# Patient Record
Sex: Male | Born: 1955 | Race: White | Hispanic: No | Smoking: Current every day smoker
Health system: Southern US, Community
[De-identification: ages and names within clinical notes are randomized; demographics above are authoritative.]

---

## 2018-02-23 ENCOUNTER — Emergency Department (HOSPITAL_COMMUNITY): Payer: No Typology Code available for payment source

## 2018-02-23 ENCOUNTER — Other Ambulatory Visit: Payer: Self-pay

## 2018-02-23 ENCOUNTER — Emergency Department (HOSPITAL_COMMUNITY)
Admission: EM | Admit: 2018-02-23 | Discharge: 2018-02-23 | Disposition: A | Discharge status: Home / Self Care | Payer: No Typology Code available for payment source | Attending: Emergency Medicine | Admitting: Emergency Medicine

## 2018-02-23 DIAGNOSIS — F1410 Cocaine abuse, uncomplicated: Secondary | ICD-10-CM | POA: Diagnosis not present

## 2018-02-23 DIAGNOSIS — Y9289 Other specified places as the place of occurrence of the external cause: Secondary | ICD-10-CM | POA: Diagnosis not present

## 2018-02-23 DIAGNOSIS — T50901A Poisoning by unspecified drugs, medicaments and biological substances, accidental (unintentional), initial encounter: Secondary | ICD-10-CM | POA: Diagnosis present

## 2018-02-23 DIAGNOSIS — Y998 Other external cause status: Secondary | ICD-10-CM | POA: Insufficient documentation

## 2018-02-23 DIAGNOSIS — F1910 Other psychoactive substance abuse, uncomplicated: Secondary | ICD-10-CM | POA: Diagnosis not present

## 2018-02-23 DIAGNOSIS — Y9389 Activity, other specified: Secondary | ICD-10-CM | POA: Insufficient documentation

## 2018-02-23 LAB — COMPREHENSIVE METABOLIC PANEL
ALBUMIN: 3.6 g/dL (ref 3.5–5.0)
ALT: 27 U/L (ref 17–63)
AST: 29 U/L (ref 15–41)
Alkaline Phosphatase: 50 U/L (ref 38–126)
Anion gap: 9 (ref 5–15)
BUN: 7 mg/dL (ref 6–20)
CHLORIDE: 106 mmol/L (ref 101–111)
CO2: 27 mmol/L (ref 22–32)
Calcium: 9 mg/dL (ref 8.9–10.3)
Creatinine, Ser: 0.99 mg/dL (ref 0.61–1.24)
GFR calc Af Amer: >60 mL/min (ref >60)
Glucose, Bld: 125 mg/dL — ABNORMAL HIGH (ref 65–99)
POTASSIUM: 2.8 mmol/L — AB (ref 3.5–5.1)
SODIUM: 142 mmol/L (ref 135–145)
Total Bilirubin: 0.8 mg/dL (ref 0.3–1.2)
Total Protein: 6.5 g/dL (ref 6.5–8.1)

## 2018-02-23 LAB — MAGNESIUM: Magnesium: 1.7 mg/dL (ref 1.7–2.4)

## 2018-02-23 LAB — CBC WITH DIFFERENTIAL/PLATELET
Basophils Absolute: 0 10*3/uL (ref 0.0–0.1)
Basophils Relative: 0 %
EOS PCT: 3 %
Eosinophils Absolute: 0.2 10*3/uL (ref 0.0–0.7)
HCT: 40.4 % (ref 39.0–52.0)
Hemoglobin: 13.4 g/dL (ref 13.0–17.0)
LYMPHS ABS: 1.9 10*3/uL (ref 0.7–4.0)
LYMPHS PCT: 26 %
MCH: 30.1 pg (ref 26.0–34.0)
MCHC: 33.2 g/dL (ref 30.0–36.0)
MCV: 90.8 fL (ref 78.0–100.0)
Monocytes Absolute: 0.8 10*3/uL (ref 0.1–1.0)
Monocytes Relative: 11 %
Neutro Abs: 4.5 10*3/uL (ref 1.7–7.7)
Neutrophils Relative %: 60 %
PLATELETS: 189 10*3/uL (ref 150–400)
RBC: 4.45 MIL/uL (ref 4.22–5.81)
RDW: 14.2 % (ref 11.5–15.5)
WBC: 7.5 10*3/uL (ref 4.0–10.5)

## 2018-02-23 LAB — I-STAT CHEM 8, ED
BUN: 7 mg/dL (ref 6–20)
CALCIUM ION: 1.17 mmol/L (ref 1.15–1.40)
CHLORIDE: 102 mmol/L (ref 101–111)
CREATININE: 0.9 mg/dL (ref 0.61–1.24)
GLUCOSE: 123 mg/dL — AB (ref 65–99)
HCT: 42 % (ref 39.0–52.0)
Hemoglobin: 14.3 g/dL (ref 13.0–17.0)
Potassium: 2.8 mmol/L — ABNORMAL LOW (ref 3.5–5.1)
Sodium: 142 mmol/L (ref 135–145)
TCO2: 27 mmol/L (ref 22–32)

## 2018-02-23 LAB — ETHANOL

## 2018-02-23 MED ORDER — NALOXONE HCL 2 MG/2ML IJ SOSY
PREFILLED_SYRINGE | INTRAMUSCULAR | Status: DC
Start: 2018-02-23 — End: 2018-02-23
  Filled 2018-02-23: qty 2

## 2018-02-23 MED ORDER — POTASSIUM CHLORIDE CRYS ER 20 MEQ PO TBCR
40.0000 meq | EXTENDED_RELEASE_TABLET | Freq: Once | ORAL | Status: AC
  Administered 2018-02-23: 40 meq via ORAL
  Filled 2018-02-23: qty 2

## 2018-02-23 NOTE — Discharge Instructions (Addendum)
Do not use heroin or cocaine.
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
## 2018-08-03 ENCOUNTER — Emergency Department (HOSPITAL_COMMUNITY)
Admission: EM | Admit: 2018-08-03 | Discharge: 2018-08-03 | Disposition: A | Discharge status: Home / Self Care | Payer: Self-pay | Attending: Emergency Medicine | Admitting: Emergency Medicine

## 2018-08-03 ENCOUNTER — Encounter (HOSPITAL_COMMUNITY): Payer: Self-pay | Admitting: *Deleted

## 2018-08-03 DIAGNOSIS — L02512 Cutaneous abscess of left hand: Secondary | ICD-10-CM | POA: Insufficient documentation

## 2018-08-03 DIAGNOSIS — L0291 Cutaneous abscess, unspecified: Secondary | ICD-10-CM

## 2018-08-03 DIAGNOSIS — F17200 Nicotine dependence, unspecified, uncomplicated: Secondary | ICD-10-CM | POA: Insufficient documentation

## 2018-08-03 MED ORDER — LIDOCAINE HCL (PF) 1 % IJ SOLN
5.0000 mL | Freq: Once | INTRAMUSCULAR | Status: AC
  Administered 2018-08-03: 5 mL
  Filled 2018-08-03: qty 30

## 2018-08-03 MED ORDER — SULFAMETHOXAZOLE-TRIMETHOPRIM 800-160 MG PO TABS
1.0000 | ORAL_TABLET | Freq: Once | ORAL | Status: AC
  Administered 2018-08-03: 1 via ORAL
  Filled 2018-08-03: qty 1

## 2018-08-03 MED ORDER — SULFAMETHOXAZOLE-TRIMETHOPRIM 800-160 MG PO TABS: 1.0000 | ORAL_TABLET | Freq: Two times a day (BID) | ORAL | 0 refills | Status: AC

## 2018-08-03 NOTE — Discharge Instructions (Signed)
Warm compresses to hand for 20 minutes at a time. Take Septra as prescribed and complete the full course. Recheck and packing removal in 2 days with your doctor, referral given for PCP if needed. Return to ER for any worsening or concerning symptoms

## 2018-08-03 NOTE — ED Triage Notes (Signed)
Pt complains of abscess to left hand x 4 days. Pt states he initially had a sore on his left hand.

## 2018-08-03 NOTE — ED Provider Notes (Signed)
[facility]-EMERGENCY DEPT Provider Note   CSN: [id number] Arrival date & time: 08/03/18  1124     History   Chief Complaint Chief Complaint  Patient presents with  . Abscess    HPI Devin PETTIJOHN is a 62 y.o. male.  62 year old right-hand-dominant, nondiabetic male presents with abscess to his left hand.  Patient states that a small cut on his hand and then 2 days ago and noticed the area was becoming red, swollen, painful.  Denies drainage, pain with movement of the fingers or hand, denies history of IV drug abuse.  No other complaints or concerns.     History reviewed. No pertinent past medical history.  There are no active problems to display for this patient.   History reviewed. No pertinent surgical history.      Home Medications    Prior to Admission medications   Medication Sig Start Date End Date Taking? Authorizing Provider  sulfamethoxazole-trimethoprim (BACTRIM DS,SEPTRA DS) 800-160 MG tablet Take 1 tablet by mouth 2 (two) times daily for 10 days. 08/03/18 08/13/18  Jeannie Fend, PA-C    Family History No family history on file.  Social History Social History   Tobacco Use  . Smoking status: Current Every Day Smoker  Substance Use Topics  . Alcohol use: Not Currently  . Drug use: Not on file     Allergies   Patient has no known allergies.   Review of Systems Review of Systems  Constitutional: Negative for chills and fever.  Musculoskeletal: Negative for arthralgias and joint swelling.  Skin: Positive for color change, rash and wound.  Allergic/Immunologic: Negative for immunocompromised state.  Neurological: Negative for weakness and numbness.  Hematological: Negative for adenopathy.  Psychiatric/Behavioral: Negative for confusion.  All other systems reviewed and are negative.    Physical Exam Updated Vital Signs BP (!) 183/92 (BP Location: Right Arm)   Pulse 70   Temp 98.2 F (36.8 C) (Oral)   Resp 18    SpO2 95%   Physical Exam  Constitutional: He is oriented to person, place, and time. He appears well-developed and well-nourished. No distress.  HENT:  Head: Normocephalic and atraumatic.  Cardiovascular: Intact distal pulses.  Pulmonary/Chest: Effort normal.  Musculoskeletal: He exhibits tenderness. He exhibits no deformity.       Hands: Neurological: He is alert and oriented to person, place, and time. No sensory deficit.  Skin: Skin is warm and dry. He is not diaphoretic. There is erythema.  Psychiatric: He has a normal mood and affect. His behavior is normal.  Nursing note and vitals reviewed.    ED Treatments / Results  Labs (all labs ordered are listed, but only abnormal results are displayed) Labs Reviewed - No data to display  EKG None  Radiology No results found.  Procedures .Marland KitchenIncision and Drainage Date/Time: 08/03/2018 2:23 PM Performed by: Jeannie Fend, PA-C Authorized by: Jeannie Fend, PA-C   Consent:    Consent obtained:  Verbal   Consent given by:  Patient   Risks discussed:  Bleeding, incomplete drainage, pain and damage to other organs   Alternatives discussed:  No treatment Universal protocol:    Procedure explained and questions answered to patient or proxy's satisfaction: yes     Relevant documents present and verified: yes     Patient identity confirmed:  Verbally with patient Location:    Type:  Abscess   Size:  3cm x 3cm    Location:  Upper extremity   Upper extremity  location:  Hand   Hand location:  L hand Pre-procedure details:    Skin preparation:  Betadine Anesthesia (see MAR for exact dosages):    Anesthesia method:  Local infiltration   Local anesthetic:  Lidocaine 1% w/o epi Procedure type:    Complexity:  Complex Procedure details:    Needle aspiration: no     Incision types:  Single straight   Incision depth:  Subcutaneous   Scalpel blade:  11   Wound management:  Probed and deloculated, irrigated with saline and  extensive cleaning   Drainage:  Purulent   Drainage amount:  Moderate   Wound treatment:  Wound left open   Packing materials:  1/2 in iodoform gauze   Amount 1/2" iodoform:  2" Post-procedure details:    Patient tolerance of procedure:  Tolerated well, no immediate complications   (including critical care time)  Medications Ordered in ED Medications  lidocaine (PF) (XYLOCAINE) 1 % injection 5 mL (5 mLs Infiltration Given 08/03/18 1416)  sulfamethoxazole-trimethoprim (BACTRIM DS,SEPTRA DS) 800-160 MG per tablet 1 tablet (1 tablet Oral Given 08/03/18 1417)     Initial Impression / Assessment and Plan / ED Course  I have reviewed the triage vital signs and the nursing notes.  Pertinent labs & imaging results that were available during my care of the patient were reviewed by me and considered in my medical decision making (see chart for details).  Clinical Course as of Aug 03 1430  Sat Aug 03, 2018  1427 61yo male with abscess to dorsum of left hand, states he had a small cut on his hand prior to onset of abscess, denies IVDA. Abscess drained without complication, dc home with Septra. Recommend wound check and packing removal in 2 days. Return to ER for any worsening or concerning symptoms.    [LM]    Clinical Course User Index [LM] Jeannie Fend, PA-C   Final Clinical Impressions(s) / ED Diagnoses   Final diagnoses:  Abscess    ED Discharge Orders         Ordered    sulfamethoxazole-trimethoprim (BACTRIM DS,SEPTRA DS) 800-160 MG tablet  2 times daily     08/03/18 1347           Jeannie Fend, PA-C 08/03/18 1431    Azalia Bilis, MD 08/04/18 [phone]

## 2018-08-03 NOTE — ED Notes (Signed)
Patient did not want to address BP with MD.

## 2018-08-03 NOTE — ED Notes (Signed)
Bed: WA06 Expected date:  Expected time:  Means of arrival:  Comments: 

## 2018-08-03 NOTE — ED Notes (Signed)
ED Provider at bedside. 

## 2019-09-25 ENCOUNTER — Encounter (HOSPITAL_COMMUNITY): Payer: Self-pay | Admitting: Emergency Medicine

## 2019-09-25 ENCOUNTER — Emergency Department (HOSPITAL_COMMUNITY)
Admission: EM | Admit: 2019-09-25 | Discharge: 2019-09-25 | Disposition: A | Discharge status: Home / Self Care | Payer: Self-pay | Attending: Emergency Medicine | Admitting: Emergency Medicine

## 2019-09-25 ENCOUNTER — Other Ambulatory Visit: Payer: Self-pay

## 2019-09-25 DIAGNOSIS — T401X1A Poisoning by heroin, accidental (unintentional), initial encounter: Secondary | ICD-10-CM | POA: Insufficient documentation

## 2019-09-25 DIAGNOSIS — F17200 Nicotine dependence, unspecified, uncomplicated: Secondary | ICD-10-CM | POA: Insufficient documentation

## 2019-09-25 MED ORDER — NALOXONE HCL 4 MG/0.1ML NA LIQD: 1.0000 | Freq: Once | NASAL | Status: DC

## 2019-09-25 NOTE — ED Notes (Signed)
Pt calling his roommates for a ride home.

## 2019-09-25 NOTE — ED Provider Notes (Signed)
[place] DEPT Provider Note   CSN: [id number] Arrival date & time: 09/25/19  0109     History Chief Complaint  Patient presents with  . Drug Overdose    Devin Gray is a 63 y.o. male with a history of heroin IV drug use who presents to the emergency department with a chief complaint by EMS with a chief complaint of of heroin overdose.  The patient reports that he injected heroin just prior to arrival.  His friends called 911 and perform chest compressions until the patient was conscious again.  He reports that he has used heroin several times per week for the last 20 years.  He reports that he did not inject an increased dose.  EMS reports that 1 mg Narcan was administered on the scene as the patient had agonal breathing and was bagged for several minutes.  In the ER, the patient is sitting upright and in no acute distress.  He has no increased work of breathing and is able to provide a history prior to arrival in the ER.  He denies alcohol use and any other recreational or illicit substance use tonight.  He denies SI, HI, or auditory visual hallucinations.   The history is provided by the patient. No language interpreter was used.       No past medical history on file.  There are no problems to display for this patient.   No past surgical history on file.     No family history on file.  Social History   Tobacco Use  . Smoking status: Current Every Day Smoker  . Smokeless tobacco: Never Used  Substance Use Topics  . Alcohol use: Not Currently  . Drug use: Yes    Frequency: 5.0 times per week    Comment: Heroin    Home Medications Prior to Admission medications   Not on File    Allergies    Patient has no known allergies.  Review of Systems   Review of Systems  Constitutional: Negative for appetite change and fever.  Respiratory: Negative for cough, shortness of breath and wheezing.   Cardiovascular: Negative for chest  pain, palpitations and leg swelling.  Gastrointestinal: Negative for abdominal pain, diarrhea, nausea and vomiting.  Genitourinary: Negative for dysuria.  Musculoskeletal: Negative for back pain.  Skin: Negative for rash.  Allergic/Immunologic: Negative for immunocompromised state.  Neurological: Negative for headaches.  Psychiatric/Behavioral: Negative for confusion.   Physical Exam Updated Vital Signs BP 130/82 (BP Location: Left Arm)   Pulse 64   Temp 97.7 F (36.5 C) (Oral)   Resp 16   SpO2 98%   Physical Exam Vitals and nursing note reviewed.  Constitutional:      General: He is not in acute distress.    Appearance: He is well-developed. He is not ill-appearing, toxic-appearing or diaphoretic.     Comments: Sitting upright.  Speaking in complete, fluent sentences.  No acute distress.  HENT:     Head: Normocephalic.  Eyes:     Conjunctiva/sclera: Conjunctivae normal.  Cardiovascular:     Rate and Rhythm: Normal rate and regular rhythm.     Pulses: Normal pulses.     Heart sounds: Normal heart sounds. No murmur. No friction rub. No gallop.      Comments: Peripheral pulses are 2+ and symmetric. Pulmonary:     Effort: Pulmonary effort is normal. No respiratory distress.     Breath sounds: No stridor. No wheezing, rhonchi or rales.  Abdominal:  General: There is no distension.     Palpations: Abdomen is soft.  Musculoskeletal:     Cervical back: Neck supple.     Comments: Track marks noted to the bilateral [facility]  Skin:    General: Skin is warm and dry.  Neurological:     Mental Status: He is alert.  Psychiatric:        Behavior: Behavior normal.     ED Results / Procedures / Treatments   Labs (all labs ordered are listed, but only abnormal results are displayed) Labs Reviewed - No data to display  EKG None  Radiology No results found.  Procedures Procedures (including critical care time)  Medications Ordered in ED Medications  naloxone (NARCAN) nasal  spray 4 mg/0.1 mL (has no administration in time range)    ED Course  I have reviewed the triage vital signs and the nursing notes.  Pertinent labs & imaging results that were available during my care of the patient were reviewed by me and considered in my medical decision making (see chart for details).    MDM Rules/Calculators/A&P                         63 year old male with a history of longstanding IV heroin use presenting after an unintentional overdose of heroin.  He was given 1 mg of Narcan by EMS.  Apparently, chest compressions were performed by his friends until EMS arrived.  Unfortunately, this patient arrived during downtime and this information was not conveyed by nursing staff until the patient's triage note appeared after the patient was discharged.  He was having no chest pain or rib pain at this time.  He was observed for more than an hour and remained hemodynamically stable.  Pulse ox never decreased below 94%.  At this time, patient would like to be discharged.  He declined peer consult.  We will give the patient a naloxone kit for home use.  He was also given community resources for substance use.  He is hemodynamically stable and in no acute distress.  Safe for discharge to home with outpatient community resources.  Final Clinical Impression(s) / ED Diagnoses Final diagnoses:  Heroin overdose, accidental or unintentional, initial encounter [facility])    Rx / [place] Orders ED Discharge Orders    None       Joanne Gavel, PA-C 09/25/19 0426    Molpus, Jenny Reichmann, MD 09/25/19 [phone]

## 2019-09-25 NOTE — Discharge Instructions (Addendum)
Thank you for allowing me to care for you today in the Emergency Department.   I would strongly recommend that you stop using heroin.  I have provided you with some outpatient resources.  You have been given a Narcan kit in case you have an accidental overdose at home.  Return to the emergency department if you stop breathing Devin Gray have another accidental overdose, develop high fevers and severe pain while you are using heroin, or other new, concerning symptoms.

## 2019-09-25 NOTE — ED Triage Notes (Signed)
Pt arrived from home via [facility] due to drug overdose of Heroin. 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 Friends at his house called 911 and performed chest compressions until the pt was conscious again. Pt received 1mg  Narcan Intranasal en route to hospital. .  BP 175/103 HR 72 RR 14 Temp 97.9  CBG 168

## 2019-11-18 ENCOUNTER — Emergency Department (HOSPITAL_COMMUNITY)
Admission: EM | Admit: 2019-11-18 | Discharge: 2019-11-18 | Disposition: A | Discharge status: Home / Self Care | Payer: Self-pay | Attending: Emergency Medicine | Admitting: Emergency Medicine

## 2019-11-18 ENCOUNTER — Emergency Department (HOSPITAL_COMMUNITY): Payer: Self-pay

## 2019-11-18 ENCOUNTER — Encounter (HOSPITAL_COMMUNITY): Payer: Self-pay | Admitting: Emergency Medicine

## 2019-11-18 ENCOUNTER — Other Ambulatory Visit: Payer: Self-pay

## 2019-11-18 DIAGNOSIS — R062 Wheezing: Secondary | ICD-10-CM | POA: Insufficient documentation

## 2019-11-18 DIAGNOSIS — F17200 Nicotine dependence, unspecified, uncomplicated: Secondary | ICD-10-CM | POA: Insufficient documentation

## 2019-11-18 DIAGNOSIS — T401X1A Poisoning by heroin, accidental (unintentional), initial encounter: Secondary | ICD-10-CM | POA: Insufficient documentation

## 2019-11-18 DIAGNOSIS — Z20822 Contact with and (suspected) exposure to covid-19: Secondary | ICD-10-CM | POA: Insufficient documentation

## 2019-11-18 LAB — RAPID URINE DRUG SCREEN, HOSP PERFORMED
Amphetamines: NOT DETECTED
Barbiturates: NOT DETECTED
Benzodiazepines: NOT DETECTED
Cocaine: POSITIVE — AB
Opiates: POSITIVE — AB
Tetrahydrocannabinol: NOT DETECTED

## 2019-11-18 LAB — CBC
HCT: 49.8 % (ref 39.0–52.0)
Hemoglobin: 15.7 g/dL (ref 13.0–17.0)
MCH: 30.4 pg (ref 26.0–34.0)
MCHC: 31.5 g/dL (ref 30.0–36.0)
MCV: 96.5 fL (ref 80.0–100.0)
Platelets: 87 10*3/uL — ABNORMAL LOW (ref 150–400)
RBC: 5.16 MIL/uL (ref 4.22–5.81)
RDW: 14 % (ref 11.5–15.5)
WBC: 9.3 10*3/uL (ref 4.0–10.5)
nRBC: 0 % (ref 0.0–0.2)

## 2019-11-18 LAB — COMPREHENSIVE METABOLIC PANEL
ALT: 29 U/L (ref 0–44)
AST: 32 U/L (ref 15–41)
Albumin: 4.4 g/dL (ref 3.5–5.0)
Alkaline Phosphatase: 82 U/L (ref 38–126)
Anion gap: 12 (ref 5–15)
BUN: 19 mg/dL (ref 8–23)
CO2: 23 mmol/L (ref 22–32)
Calcium: 9.7 mg/dL (ref 8.9–10.3)
Chloride: 105 mmol/L (ref 98–111)
Creatinine, Ser: 1.11 mg/dL (ref 0.61–1.24)
GFR calc Af Amer: >60 mL/min (ref >60)
GFR calc non Af Amer: >60 mL/min (ref >60)
Glucose, Bld: 112 mg/dL — ABNORMAL HIGH (ref 70–99)
Potassium: 3.8 mmol/L (ref 3.5–5.1)
Sodium: 140 mmol/L (ref 135–145)
Total Bilirubin: 0.7 mg/dL (ref 0.3–1.2)
Total Protein: 8 g/dL (ref 6.5–8.1)

## 2019-11-18 LAB — CBG MONITORING, ED: Glucose-Capillary: 99 mg/dL (ref 70–99)

## 2019-11-18 LAB — ETHANOL: Alcohol, Ethyl (B): <10 mg/dL (ref <10)

## 2019-11-18 LAB — RESPIRATORY PANEL BY RT PCR (FLU A&B, COVID)
Influenza A by PCR: NEGATIVE
Influenza B by PCR: NEGATIVE
SARS Coronavirus 2 by RT PCR: NEGATIVE

## 2019-11-18 LAB — ACETAMINOPHEN LEVEL: Acetaminophen (Tylenol), Serum: <10 ug/mL — ABNORMAL LOW (ref 10–30)

## 2019-11-18 LAB — SALICYLATE LEVEL: Salicylate Lvl: <7 mg/dL — ABNORMAL LOW (ref 7.0–30.0)

## 2019-11-18 MED ORDER — ALBUTEROL SULFATE HFA 108 (90 BASE) MCG/ACT IN AERS: 2.0000 | INHALATION_SPRAY | RESPIRATORY_TRACT | 0 refills | Status: AC | PRN

## 2019-11-18 MED ORDER — ALBUTEROL SULFATE HFA 108 (90 BASE) MCG/ACT IN AERS
4.0000 | INHALATION_SPRAY | Freq: Once | RESPIRATORY_TRACT | Status: AC
  Administered 2019-11-18: 05:00:00 4 via RESPIRATORY_TRACT
  Filled 2019-11-18: qty 6.7

## 2019-11-18 NOTE — ED Provider Notes (Signed)
[facility] DEPT Provider Note   CSN: [id number] Arrival date & time: 11/18/19  0347     History Chief Complaint  Patient presents with  . Drug Overdose    Devin Gray is a 64 y.o. male.  HPI     This is a 64 year old male with a history of polysubstance abuse who presents following an overdose.  Patient reports that he shot up heroin earlier this evening.  Denies any other coingestions or alcohol.  He was found by a roommate apneic and compressions were started.  He reportedly had several episodes of emesis.  Was noted to have O2 sats in the mid 80s per EMS and there was some concern for aspiration.  Denies any recent fevers.  He did receive 4 mg of Narcan.  He reports history of overdose accidentally in the past on heroin.  He has previously received Narcan.  Currently he is awake and oriented.  He reports that he just "feels bad."  Denies any chest pain, shortness of breath, abdominal pain, nausea, vomiting.  History reviewed. No pertinent past medical history.  There are no problems to display for this patient.   History reviewed. No pertinent surgical history.     No family history on file.  Social History   Tobacco Use  . Smoking status: Current Every Day Smoker  . Smokeless tobacco: Never Used  Substance Use Topics  . Alcohol use: Not Currently  . Drug use: Yes    Frequency: 5.0 times per week    Comment: Heroin    Home Medications Prior to Admission medications   Medication Sig Start Date End Date Taking? Authorizing Provider  albuterol (VENTOLIN HFA) 108 (90 Base) MCG/ACT inhaler Inhale 2 puffs into the lungs every 4 (four) hours as needed for wheezing or shortness of breath. 11/18/19   Knut Rondinelli, Barbette Hair, MD    Allergies    Patient has no known allergies.  Review of Systems   Review of Systems  Constitutional: Negative for fever.  Respiratory: Positive for cough. Negative for shortness of breath.   Cardiovascular:  Negative for chest pain.  Gastrointestinal: Positive for nausea and vomiting. Negative for abdominal pain.  Genitourinary: Negative for dysuria.  Neurological: Negative for light-headedness.  Psychiatric/Behavioral: Negative for suicidal ideas.  All other systems reviewed and are negative.   Physical Exam Updated Vital Signs BP 120/65   Pulse 65   Temp 98.6 F (37 C) (Oral)   Resp 15   SpO2 93%   Physical Exam Vitals and nursing note reviewed.  Constitutional:      Appearance: He is well-developed. He is not ill-appearing.  HENT:     Head: Normocephalic and atraumatic.     Nose: Nose normal.     Mouth/Throat:     Mouth: Mucous membranes are moist.  Eyes:     Pupils: Pupils are equal, round, and reactive to light.     Comments: Pupils 4 mm and reactive bilaterally  Cardiovascular:     Rate and Rhythm: Normal rate and regular rhythm.     Heart sounds: Normal heart sounds. No murmur.  Pulmonary:     Effort: Pulmonary effort is normal. No respiratory distress.     Breath sounds: Wheezing and rales present.  Abdominal:     General: Bowel sounds are normal.     Palpations: Abdomen is soft.     Tenderness: There is no abdominal tenderness. There is no rebound.  Musculoskeletal:     Cervical back: Neck  supple.     Right lower leg: No edema.     Left lower leg: No edema.  Lymphadenopathy:     Cervical: No cervical adenopathy.  Skin:    General: Skin is warm and dry.     Comments: Track marks bilateral upper extremities  Neurological:     Mental Status: He is alert and oriented to person, place, and time.  Psychiatric:        Mood and Affect: Mood normal.     ED Results / Procedures / Treatments   Labs (all labs ordered are listed, but only abnormal results are displayed) Labs Reviewed  COMPREHENSIVE METABOLIC PANEL - Abnormal; Notable for the following components:      Result Value   Glucose, Bld 112 (*)    All other components within normal limits  SALICYLATE  LEVEL - Abnormal; Notable for the following components:   Salicylate Lvl <7.0 (*)    All other components within normal limits  ACETAMINOPHEN LEVEL - Abnormal; Notable for the following components:   Acetaminophen (Tylenol), Serum <10 (*)    All other components within normal limits  CBC - Abnormal; Notable for the following components:   Platelets 87 (*)    All other components within normal limits  RAPID URINE DRUG SCREEN, HOSP PERFORMED - Abnormal; Notable for the following components:   Opiates POSITIVE (*)    Cocaine POSITIVE (*)    All other components within normal limits  RESPIRATORY PANEL BY RT PCR (FLU A&B, COVID)  ETHANOL  CBG MONITORING, ED    EKG EKG Interpretation  Date/Time:  Tuesday November 18 2019 04:12:30 EST Ventricular Rate:  71 PR Interval:    QRS Duration: 91 QT Interval:  389 QTC Calculation: 423 R Axis:   50 Text Interpretation: Sinus rhythm Atrial premature complex Prolonged PR interval Confirmed by Ross Marcus ([id number]) on 11/18/2019 4:57:10 AM   Radiology DG Chest Portable 1 View  Result Date: 11/18/2019 CLINICAL DATA:  Hypoxia EXAM: PORTABLE CHEST 1 VIEW COMPARISON:  02/23/2018 FINDINGS: Normal heart size and mediastinal contours. No acute infiltrate or edema. No effusion or pneumothorax. No acute osseous findings. Artifact from EKG leads. IMPRESSION: No active disease. Electronically Signed   By: Marnee Spring M.D.   On: 11/18/2019 04:36    Procedures Procedures (including critical care time)  Medications Ordered in ED Medications  albuterol (VENTOLIN HFA) 108 (90 Base) MCG/ACT inhaler 4 puff (4 puffs Inhalation Given 11/18/19 0444)    ED Course  I have reviewed the triage vital signs and the nursing notes.  Pertinent labs & imaging results that were available during my care of the patient were reviewed by me and considered in my medical decision making (see chart for details).  Clinical Course as of Nov 18 1101  Tue Nov 18, 2019  0601  Patient resting comfortably.  O2 sats 92% off oxygen.  Repeat pulmonary exam clear with only occasional wheeze.  We will continue to monitor.   [CH]    Clinical Course User Index [CH] Niaomi Cartaya, Mayer Masker, MD   MDM Rules/Calculators/A&P                      Patient presents after an apparent heroin overdose.  Awake on exam.  Hypoxic for EMS and wheezing for me on 2 L.  Otherwise awake and oriented.  EKG nonischemic and no arrhythmia.  Labwork unremarkable.  Patient monitored without recurrent respiratory depression.  Wheezing improved with HFA.  Patient weaned off  O2.  Able to ambulate and po challenge.  After history, exam, and medical workup I feel the patient has been appropriately medically screened and is safe for discharge home. Pertinent diagnoses were discussed with the patient. Patient was given return precautions.    Final Clinical Impression(s) / ED Diagnoses Final diagnoses:  Accidental overdose of heroin, initial encounter (HCC)  Wheezing    Rx / DC Orders ED Discharge Orders         Ordered    albuterol (VENTOLIN HFA) 108 (90 Base) MCG/ACT inhaler  Every 4 hours PRN     11/18/19 0721           Shon Baton, MD 11/19/19 1108

## 2019-11-18 NOTE — Discharge Instructions (Signed)
You were seen today after an opiate overdose.  You should avoid illicit drug use.  You can die.  Of note, your noted to be wheezy.  You should stop smoking.  Take inhaler as needed.

## 2019-11-18 NOTE — ED Triage Notes (Signed)
Patient here from home with reports of heroin od. Reports that patient was apneic and roommate started compressions on belly. Patient had 2 episodes of vomiting and potentially aspirated. Per EMS decreased sats 86%, rhonchi in all fields, and sudden onset of cough. Non-rebreather 94%. Hx of polysubstance abuse. 4mg  Narcan given.

## 2019-12-06 ENCOUNTER — Other Ambulatory Visit: Payer: Self-pay

## 2019-12-06 ENCOUNTER — Emergency Department (HOSPITAL_COMMUNITY)
Admission: EM | Admit: 2019-12-06 | Discharge: 2019-12-06 | Disposition: A | Discharge status: Home / Self Care | Payer: Self-pay | Attending: Emergency Medicine | Admitting: Emergency Medicine

## 2019-12-06 ENCOUNTER — Encounter (HOSPITAL_COMMUNITY): Payer: Self-pay

## 2019-12-06 DIAGNOSIS — T401X1A Poisoning by heroin, accidental (unintentional), initial encounter: Secondary | ICD-10-CM | POA: Insufficient documentation

## 2019-12-06 DIAGNOSIS — F17200 Nicotine dependence, unspecified, uncomplicated: Secondary | ICD-10-CM | POA: Insufficient documentation

## 2019-12-06 LAB — COMPREHENSIVE METABOLIC PANEL
ALT: 33 U/L (ref 0–44)
AST: 38 U/L (ref 15–41)
Albumin: 4.6 g/dL (ref 3.5–5.0)
Alkaline Phosphatase: 82 U/L (ref 38–126)
Anion gap: 9 (ref 5–15)
BUN: 18 mg/dL (ref 8–23)
CO2: 29 mmol/L (ref 22–32)
Calcium: 9.7 mg/dL (ref 8.9–10.3)
Chloride: 102 mmol/L (ref 98–111)
Creatinine, Ser: 1.22 mg/dL (ref 0.61–1.24)
GFR calc Af Amer: >60 mL/min (ref >60)
GFR calc non Af Amer: >60 mL/min (ref >60)
Glucose, Bld: 175 mg/dL — ABNORMAL HIGH (ref 70–99)
Potassium: 3.8 mmol/L (ref 3.5–5.1)
Sodium: 140 mmol/L (ref 135–145)
Total Bilirubin: 0.9 mg/dL (ref 0.3–1.2)
Total Protein: 8.3 g/dL — ABNORMAL HIGH (ref 6.5–8.1)

## 2019-12-06 LAB — CBC WITH DIFFERENTIAL/PLATELET
Abs Immature Granulocytes: 0.06 10*3/uL (ref 0.00–0.07)
Basophils Absolute: 0.1 10*3/uL (ref 0.0–0.1)
Basophils Relative: 1 %
Eosinophils Absolute: 0.4 10*3/uL (ref 0.0–0.5)
Eosinophils Relative: 6 %
HCT: 46.7 % (ref 39.0–52.0)
Hemoglobin: 15.3 g/dL (ref 13.0–17.0)
Immature Granulocytes: 1 %
Lymphocytes Relative: 43 %
Lymphs Abs: 3.1 10*3/uL (ref 0.7–4.0)
MCH: 30.5 pg (ref 26.0–34.0)
MCHC: 32.8 g/dL (ref 30.0–36.0)
MCV: 93 fL (ref 80.0–100.0)
Monocytes Absolute: 0.9 10*3/uL (ref 0.1–1.0)
Monocytes Relative: 13 %
Neutro Abs: 2.5 10*3/uL (ref 1.7–7.7)
Neutrophils Relative %: 36 %
Platelets: 253 10*3/uL (ref 150–400)
RBC: 5.02 MIL/uL (ref 4.22–5.81)
RDW: 13.8 % (ref 11.5–15.5)
WBC: 6.9 10*3/uL (ref 4.0–10.5)
nRBC: 0 % (ref 0.0–0.2)

## 2019-12-06 LAB — ETHANOL: Alcohol, Ethyl (B): <10 mg/dL (ref <10)

## 2019-12-06 MED ORDER — ONDANSETRON HCL 4 MG/2ML IJ SOLN
4.0000 mg | Freq: Once | INTRAMUSCULAR | Status: AC
  Administered 2019-12-06: 08:00:00 4 mg via INTRAVENOUS
  Filled 2019-12-06: qty 2

## 2019-12-06 MED ORDER — CLONIDINE HCL 0.1 MG PO TABS
0.1000 mg | ORAL_TABLET | Freq: Once | ORAL | Status: AC
  Administered 2019-12-06: 02:00:00 0.1 mg via ORAL
  Filled 2019-12-06: qty 1

## 2019-12-06 MED ORDER — ALUM & MAG HYDROXIDE-SIMETH 200-200-20 MG/5ML PO SUSP
30.0000 mL | Freq: Once | ORAL | Status: AC
  Administered 2019-12-06: 10:00:00 30 mL via ORAL
  Filled 2019-12-06: qty 30

## 2019-12-06 MED ORDER — ONDANSETRON HCL 4 MG/2ML IJ SOLN
4.0000 mg | Freq: Once | INTRAMUSCULAR | Status: AC
  Administered 2019-12-06: 02:00:00 4 mg via INTRAVENOUS
  Filled 2019-12-06: qty 2

## 2019-12-06 NOTE — ED Notes (Signed)
Pt removed nasal canula and oxygen saturations dropped to 82 on RA. Lurena Joiner, RN made aware. Pt placed back on 2L nasal canula and oxygen saturations returned to 96%.

## 2019-12-06 NOTE — ED Notes (Signed)
He has eaten breakfast. He ambulates without difficulty and tells me he lives "not far away--it's just a little ways down [address] and I can walk there-no problem."

## 2019-12-06 NOTE — ED Notes (Signed)
My plan is to wait to get him some breakfast, of which I spoke with pt., and with which he agreed. We have yet to receive breakfast, I have had our secretary notify our dietary department twice.

## 2019-12-06 NOTE — ED Triage Notes (Signed)
Per EMS, patient's roommate called 911 as patient overdosed on heroin. Roommate reports that patient was not breathing and has one episode of emesis. Upon EMS arrival, patient was satting at 88% on RA, once given 3L on nasal cannula, patient back up to 99%. EMS gave patient 2mg  of narcan IN.

## 2019-12-06 NOTE — ED Notes (Signed)
Pt had removed his nasal cannula, and oxygen level was staying in the mid 90s. His oxygen level dropped off into the 70s, and did not increase when I asked him to breathe deeply. I put his nasal cannula back in, and his oxygen level returned to 90

## 2019-12-06 NOTE — ED Provider Notes (Signed)
[facility] DEPT Provider Note: Georgena Spurling, MD, FACEP  CSN: [id number] MRN: [id number] ARRIVAL: 12/06/19 at [place]: RESB/RESB   CHIEF COMPLAINT  Drug Overdose   HISTORY OF PRESENT ILLNESS  12/06/19 1:37 AM Devin Gray is a 64 y.o. male with a history of heroin abuse.  His roommate called EMS this morning after finding the patient "not breathing" and having had one episode of vomiting.  On EMS arrival the patient's breathing was decreased with an oxygen saturation of 88% on room air.  He was given supplemental oxygen and 2 mg of intranasal Narcan with return of breathing and improvement in his mental status.  He is now complaining of nausea and generalized pain which he rates as a 6 out of 10.  The patient states the overdose was accidental and he was seen in the ED on 11/18/2019 for the same.   History reviewed. No pertinent past medical history.  History reviewed. No pertinent surgical history.  No family history on file.  Social History   Tobacco Use  . Smoking status: Current Every Day Smoker  . Smokeless tobacco: Never Used  Substance Use Topics  . Alcohol use: Not Currently  . Drug use: Yes    Frequency: 5.0 times per week    Comment: Heroin    Prior to Admission medications   Medication Sig Start Date End Date Taking? Authorizing Provider  albuterol (VENTOLIN HFA) 108 (90 Base) MCG/ACT inhaler Inhale 2 puffs into the lungs every 4 (four) hours as needed for wheezing or shortness of breath. 11/18/19  Yes Horton, Barbette Hair, MD    Allergies Patient has no known allergies.   REVIEW OF SYSTEMS  Negative except as noted here or in the History of Present Illness.   PHYSICAL EXAMINATION  Initial Vital Signs Blood pressure (!) 222/107, pulse 63, temperature 97.8 F (36.6 C), temperature source Oral, resp. rate 17, height 5\' 6"  (1.676 m), weight 72.6 kg, SpO2 99 %.  Examination General: Well-developed, well-nourished male in no acute distress; appearance  consistent with age of record HENT: normocephalic; atraumatic Eyes: pupils equal, round and reactive to light; extraocular muscles intact Neck: supple Heart: regular rate and rhythm Lungs: clear to auscultation bilaterally Abdomen: soft; nondistended; nontender; bowel sounds present Extremities: No deformity; full range of motion; pulses normal Neurologic: Awake but lethargic; motor function intact in all extremities and symmetric; no facial droop Skin: Warm and dry Psychiatric: Flat affect; denies SI or HI   RESULTS  Summary of this visit's results, reviewed and interpreted by myself:   EKG Interpretation  Date/Time:    Ventricular Rate:    PR Interval:    QRS Duration:   QT Interval:    QTC Calculation:   R Axis:     Text Interpretation:        Laboratory Studies: Results for orders placed or performed during the hospital encounter of 12/06/19 (from the past 24 hour(s))  Ethanol     Status: None   Collection Time: 12/06/19  1:32 AM  Result Value Ref Range   Alcohol, Ethyl (B) <10 <10 mg/dL  CBC with Differential/Platelet     Status: None   Collection Time: 12/06/19  1:53 AM  Result Value Ref Range   WBC 6.9 4.0 - 10.5 K/uL   RBC 5.02 4.22 - 5.81 MIL/uL   Hemoglobin 15.3 13.0 - 17.0 g/dL   HCT 46.7 39.0 - 52.0 %   MCV 93.0 80.0 - 100.0 fL   MCH 30.5 26.0 -  34.0 pg   MCHC 32.8 30.0 - 36.0 g/dL   RDW 65.7 84.6 - 96.2 %   Platelets 253 150 - 400 K/uL   nRBC 0.0 0.0 - 0.2 %   Neutrophils Relative % 36 %   Neutro Abs 2.5 1.7 - 7.7 K/uL   Lymphocytes Relative 43 %   Lymphs Abs 3.1 0.7 - 4.0 K/uL   Monocytes Relative 13 %   Monocytes Absolute 0.9 0.1 - 1.0 K/uL   Eosinophils Relative 6 %   Eosinophils Absolute 0.4 0.0 - 0.5 K/uL   Basophils Relative 1 %   Basophils Absolute 0.1 0.0 - 0.1 K/uL   Immature Granulocytes 1 %   Abs Immature Granulocytes 0.06 0.00 - 0.07 K/uL  Comprehensive metabolic panel     Status: Abnormal   Collection Time: 12/06/19  1:53 AM    Result Value Ref Range   Sodium 140 135 - 145 mmol/L   Potassium 3.8 3.5 - 5.1 mmol/L   Chloride 102 98 - 111 mmol/L   CO2 29 22 - 32 mmol/L   Glucose, Bld 175 (H) 70 - 99 mg/dL   BUN 18 8 - 23 mg/dL   Creatinine, Ser 9.52 0.61 - 1.24 mg/dL   Calcium 9.7 8.9 - 84.1 mg/dL   Total Protein 8.3 (H) 6.5 - 8.1 g/dL   Albumin 4.6 3.5 - 5.0 g/dL   AST 38 15 - 41 U/L   ALT 33 0 - 44 U/L   Alkaline Phosphatase 82 38 - 126 U/L   Total Bilirubin 0.9 0.3 - 1.2 mg/dL   GFR calc non Af Amer >60 >60 mL/min   GFR calc Af Amer >60 >60 mL/min   Anion gap 9 5 - 15   Imaging Studies: No results found.  ED COURSE and MDM  Nursing notes, initial and subsequent vitals signs, including pulse oximetry, reviewed and interpreted by myself.  Vitals:   12/06/19 0530 12/06/19 0600 12/06/19 0615 12/06/19 0645  BP: (!) 156/100 (!) 146/81 (!) 151/88 (!) 164/91  Pulse: 63 62 64 65  Resp: 15 13 14 17   Temp:      TempSrc:      SpO2: 94% 95% 93% 94%  Weight:      Height:       Medications  ondansetron (ZOFRAN) injection 4 mg (4 mg Intravenous Given 12/06/19 0143)  cloNIDine (CATAPRES) tablet 0.1 mg (0.1 mg Oral Given 12/06/19 0145)   7:15 AM Patient has been arousable but mostly somnolent since arrival.  His vital signs have been stable.  Will discharge when he was awake.  PROCEDURES  Procedures   ED DIAGNOSES     ICD-10-CM   1. Accidental overdose of heroin, initial encounter [facility])  T40.1X1A        [facility], MD 12/06/19 [id number]

## 2019-12-06 NOTE — ED Notes (Signed)
He is awake and oriented x 4 with clear speech. He tells me he feels "a little sleepy".

## 2020-01-04 ENCOUNTER — Other Ambulatory Visit: Payer: Self-pay

## 2020-01-04 ENCOUNTER — Inpatient Hospital Stay (HOSPITAL_COMMUNITY): Payer: Medicaid Other

## 2020-01-04 ENCOUNTER — Encounter (HOSPITAL_COMMUNITY): Payer: Self-pay | Admitting: Emergency Medicine

## 2020-01-04 ENCOUNTER — Emergency Department (HOSPITAL_COMMUNITY): Payer: Medicaid Other

## 2020-01-04 ENCOUNTER — Inpatient Hospital Stay (HOSPITAL_COMMUNITY)
Admission: EM | Admit: 2020-01-04 | Discharge: 2020-01-15 | DRG: 917 | Disposition: E | Payer: Medicaid Other | Attending: Pulmonary Disease | Admitting: Pulmonary Disease

## 2020-01-04 DIAGNOSIS — T405X1A Poisoning by cocaine, accidental (unintentional), initial encounter: Secondary | ICD-10-CM | POA: Diagnosis present

## 2020-01-04 DIAGNOSIS — I469 Cardiac arrest, cause unspecified: Secondary | ICD-10-CM | POA: Diagnosis present

## 2020-01-04 DIAGNOSIS — J9601 Acute respiratory failure with hypoxia: Secondary | ICD-10-CM | POA: Diagnosis present

## 2020-01-04 DIAGNOSIS — Z515 Encounter for palliative care: Secondary | ICD-10-CM | POA: Diagnosis not present

## 2020-01-04 DIAGNOSIS — Y92009 Unspecified place in unspecified non-institutional (private) residence as the place of occurrence of the external cause: Secondary | ICD-10-CM

## 2020-01-04 DIAGNOSIS — G931 Anoxic brain damage, not elsewhere classified: Secondary | ICD-10-CM | POA: Diagnosis present

## 2020-01-04 DIAGNOSIS — Z978 Presence of other specified devices: Secondary | ICD-10-CM

## 2020-01-04 DIAGNOSIS — Z66 Do not resuscitate: Secondary | ICD-10-CM | POA: Diagnosis not present

## 2020-01-04 DIAGNOSIS — E874 Mixed disorder of acid-base balance: Secondary | ICD-10-CM | POA: Diagnosis present

## 2020-01-04 DIAGNOSIS — E875 Hyperkalemia: Secondary | ICD-10-CM | POA: Diagnosis present

## 2020-01-04 DIAGNOSIS — Z0189 Encounter for other specified special examinations: Secondary | ICD-10-CM

## 2020-01-04 DIAGNOSIS — Z20822 Contact with and (suspected) exposure to covid-19: Secondary | ICD-10-CM | POA: Diagnosis present

## 2020-01-04 DIAGNOSIS — G9341 Metabolic encephalopathy: Secondary | ICD-10-CM | POA: Diagnosis present

## 2020-01-04 DIAGNOSIS — E876 Hypokalemia: Secondary | ICD-10-CM | POA: Diagnosis not present

## 2020-01-04 DIAGNOSIS — K4090 Unilateral inguinal hernia, without obstruction or gangrene, not specified as recurrent: Secondary | ICD-10-CM | POA: Diagnosis present

## 2020-01-04 DIAGNOSIS — E870 Hyperosmolality and hypernatremia: Secondary | ICD-10-CM | POA: Diagnosis not present

## 2020-01-04 DIAGNOSIS — N170 Acute kidney failure with tubular necrosis: Secondary | ICD-10-CM | POA: Diagnosis present

## 2020-01-04 DIAGNOSIS — J690 Pneumonitis due to inhalation of food and vomit: Secondary | ICD-10-CM | POA: Diagnosis present

## 2020-01-04 DIAGNOSIS — T401X1A Poisoning by heroin, accidental (unintentional), initial encounter: Principal | ICD-10-CM | POA: Diagnosis present

## 2020-01-04 DIAGNOSIS — F1110 Opioid abuse, uncomplicated: Secondary | ICD-10-CM | POA: Diagnosis present

## 2020-01-04 DIAGNOSIS — Z5289 Donor of other specified organs or tissues: Secondary | ICD-10-CM

## 2020-01-04 LAB — POCT I-STAT 7, (LYTES, BLD GAS, ICA,H+H)
Acid-base deficit: 12 mmol/L — ABNORMAL HIGH (ref 0.0–2.0)
Acid-base deficit: 4 mmol/L — ABNORMAL HIGH (ref 0.0–2.0)
Acid-base deficit: 9 mmol/L — ABNORMAL HIGH (ref 0.0–2.0)
Acid-base deficit: 9 mmol/L — ABNORMAL HIGH (ref 0.0–2.0)
Acid-base deficit: 10 mmol/L — ABNORMAL HIGH (ref 0.0–2.0)
Bicarbonate: 20.2 mmol/L (ref 20.0–28.0)
Bicarbonate: 21.2 mmol/L (ref 20.0–28.0)
Bicarbonate: 20.4 mmol/L (ref 20.0–28.0)
Bicarbonate: 20.6 mmol/L (ref 20.0–28.0)
Bicarbonate: 17.2 mmol/L — ABNORMAL LOW (ref 20.0–28.0)
Calcium, Ion: 1.22 mmol/L (ref 1.15–1.40)
Calcium, Ion: 1.02 mmol/L — ABNORMAL LOW (ref 1.15–1.40)
Calcium, Ion: 1.06 mmol/L — ABNORMAL LOW (ref 1.15–1.40)
Calcium, Ion: 1.09 mmol/L — ABNORMAL LOW (ref 1.15–1.40)
Calcium, Ion: 0.93 mmol/L — ABNORMAL LOW (ref 1.15–1.40)
HCT: 42 % (ref 39.0–52.0)
HCT: 48 % (ref 39.0–52.0)
HCT: 51 % (ref 39.0–52.0)
HCT: 52 % (ref 39.0–52.0)
HCT: 43 % (ref 39.0–52.0)
Hemoglobin: 14.3 g/dL (ref 13.0–17.0)
Hemoglobin: 16.3 g/dL (ref 13.0–17.0)
Hemoglobin: 17.3 g/dL — ABNORMAL HIGH (ref 13.0–17.0)
Hemoglobin: 17.7 g/dL — ABNORMAL HIGH (ref 13.0–17.0)
Hemoglobin: 14.6 g/dL (ref 13.0–17.0)
O2 Saturation: 97 %
O2 Saturation: 100 %
O2 Saturation: 89 %
O2 Saturation: 94 %
O2 Saturation: 98 %
Patient temperature: 94
Patient temperature: 33.4
Patient temperature: 32.8
Patient temperature: 33
Patient temperature: 33.3
Potassium: 5.9 mmol/L — ABNORMAL HIGH (ref 3.5–5.1)
Potassium: 4.7 mmol/L (ref 3.5–5.1)
Potassium: 3.8 mmol/L (ref 3.5–5.1)
Potassium: 3.4 mmol/L — ABNORMAL LOW (ref 3.5–5.1)
Potassium: 2.3 mmol/L — CL (ref 3.5–5.1)
Sodium: 140 mmol/L (ref 135–145)
Sodium: 137 mmol/L (ref 135–145)
Sodium: 141 mmol/L (ref 135–145)
Sodium: 145 mmol/L (ref 135–145)
Sodium: 150 mmol/L — ABNORMAL HIGH (ref 135–145)
TCO2: 22 mmol/L (ref 22–32)
TCO2: 22 mmol/L (ref 22–32)
TCO2: 22 mmol/L (ref 22–32)
TCO2: 22 mmol/L (ref 22–32)
TCO2: 18 mmol/L — ABNORMAL LOW (ref 22–32)
pCO2 arterial: 65.7 mmHg (ref 32.0–48.0)
pCO2 arterial: 32.4 mmHg (ref 32.0–48.0)
pCO2 arterial: 44.9 mmHg (ref 32.0–48.0)
pCO2 arterial: 46.5 mmHg (ref 32.0–48.0)
pCO2 arterial: 34.1 mmHg (ref 32.0–48.0)
pH, Arterial: 7.079 — CL (ref 7.350–7.450)
pH, Arterial: 7.408 (ref 7.350–7.450)
pH, Arterial: 7.242 — ABNORMAL LOW (ref 7.350–7.450)
pH, Arterial: 7.232 — ABNORMAL LOW (ref 7.350–7.450)
pH, Arterial: 7.291 — ABNORMAL LOW (ref 7.350–7.450)
pO2, Arterial: 112 mmHg — ABNORMAL HIGH (ref 83.0–108.0)
pO2, Arterial: 456 mmHg — ABNORMAL HIGH (ref 83.0–108.0)
pO2, Arterial: 52 mmHg — ABNORMAL LOW (ref 83.0–108.0)
pO2, Arterial: 68 mmHg — ABNORMAL LOW (ref 83.0–108.0)
pO2, Arterial: 107 mmHg (ref 83.0–108.0)

## 2020-01-04 LAB — BRAIN NATRIURETIC PEPTIDE: B Natriuretic Peptide: 220.4 pg/mL — ABNORMAL HIGH (ref 0.0–100.0)

## 2020-01-04 LAB — CBC
HCT: 49.7 % (ref 39.0–52.0)
Hemoglobin: 16.1 g/dL (ref 13.0–17.0)
MCH: 30.6 pg (ref 26.0–34.0)
MCHC: 32.4 g/dL (ref 30.0–36.0)
MCV: 94.5 fL (ref 80.0–100.0)
Platelets: 141 10*3/uL — ABNORMAL LOW (ref 150–400)
RBC: 5.26 MIL/uL (ref 4.22–5.81)
RDW: 13.5 % (ref 11.5–15.5)
WBC: 8 10*3/uL (ref 4.0–10.5)
nRBC: 0 % (ref 0.0–0.2)

## 2020-01-04 LAB — BASIC METABOLIC PANEL
Anion gap: 17 — ABNORMAL HIGH (ref 5–15)
Anion gap: 18 — ABNORMAL HIGH (ref 5–15)
Anion gap: 15 (ref 5–15)
Anion gap: 10 (ref 5–15)
BUN: 23 mg/dL (ref 8–23)
BUN: 33 mg/dL — ABNORMAL HIGH (ref 8–23)
BUN: 37 mg/dL — ABNORMAL HIGH (ref 8–23)
BUN: 41 mg/dL — ABNORMAL HIGH (ref 8–23)
CO2: 20 mmol/L — ABNORMAL LOW (ref 22–32)
CO2: 18 mmol/L — ABNORMAL LOW (ref 22–32)
CO2: 19 mmol/L — ABNORMAL LOW (ref 22–32)
CO2: 21 mmol/L — ABNORMAL LOW (ref 22–32)
Calcium: 7.9 mg/dL — ABNORMAL LOW (ref 8.9–10.3)
Calcium: 7.7 mg/dL — ABNORMAL LOW (ref 8.9–10.3)
Calcium: 7.4 mg/dL — ABNORMAL LOW (ref 8.9–10.3)
Calcium: 7.5 mg/dL — ABNORMAL LOW (ref 8.9–10.3)
Chloride: 103 mmol/L (ref 98–111)
Chloride: 107 mmol/L (ref 98–111)
Chloride: 109 mmol/L (ref 98–111)
Chloride: 112 mmol/L — ABNORMAL HIGH (ref 98–111)
Creatinine, Ser: 1.58 mg/dL — ABNORMAL HIGH (ref 0.61–1.24)
Creatinine, Ser: 1.75 mg/dL — ABNORMAL HIGH (ref 0.61–1.24)
Creatinine, Ser: 1.72 mg/dL — ABNORMAL HIGH (ref 0.61–1.24)
Creatinine, Ser: 1.81 mg/dL — ABNORMAL HIGH (ref 0.61–1.24)
GFR calc Af Amer: 53 mL/min — ABNORMAL LOW (ref >60)
GFR calc Af Amer: 47 mL/min — ABNORMAL LOW (ref >60)
GFR calc Af Amer: 48 mL/min — ABNORMAL LOW (ref >60)
GFR calc Af Amer: 45 mL/min — ABNORMAL LOW (ref >60)
GFR calc non Af Amer: 46 mL/min — ABNORMAL LOW (ref >60)
GFR calc non Af Amer: 40 mL/min — ABNORMAL LOW (ref >60)
GFR calc non Af Amer: 41 mL/min — ABNORMAL LOW (ref >60)
GFR calc non Af Amer: 39 mL/min — ABNORMAL LOW (ref >60)
Glucose, Bld: 140 mg/dL — ABNORMAL HIGH (ref 70–99)
Glucose, Bld: 205 mg/dL — ABNORMAL HIGH (ref 70–99)
Glucose, Bld: 226 mg/dL — ABNORMAL HIGH (ref 70–99)
Glucose, Bld: 180 mg/dL — ABNORMAL HIGH (ref 70–99)
Potassium: 6.6 mmol/L (ref 3.5–5.1)
Potassium: 4.2 mmol/L (ref 3.5–5.1)
Potassium: 3.6 mmol/L (ref 3.5–5.1)
Potassium: 3.3 mmol/L — ABNORMAL LOW (ref 3.5–5.1)
Sodium: 140 mmol/L (ref 135–145)
Sodium: 143 mmol/L (ref 135–145)
Sodium: 143 mmol/L (ref 135–145)
Sodium: 143 mmol/L (ref 135–145)

## 2020-01-04 LAB — CBC WITH DIFFERENTIAL/PLATELET
Abs Immature Granulocytes: 0.86 10*3/uL — ABNORMAL HIGH (ref 0.00–0.07)
Basophils Absolute: 0.1 10*3/uL (ref 0.0–0.1)
Basophils Relative: 1 %
Eosinophils Absolute: 0.3 10*3/uL (ref 0.0–0.5)
Eosinophils Relative: 2 %
HCT: 44.4 % (ref 39.0–52.0)
Hemoglobin: 13.7 g/dL (ref 13.0–17.0)
Immature Granulocytes: 5 %
Lymphocytes Relative: 16 %
Lymphs Abs: 2.9 10*3/uL (ref 0.7–4.0)
MCH: 30.4 pg (ref 26.0–34.0)
MCHC: 30.9 g/dL (ref 30.0–36.0)
MCV: 98.4 fL (ref 80.0–100.0)
Monocytes Absolute: 0.7 10*3/uL (ref 0.1–1.0)
Monocytes Relative: 4 %
Neutro Abs: 13.8 10*3/uL — ABNORMAL HIGH (ref 1.7–7.7)
Neutrophils Relative %: 72 %
Platelets: 152 10*3/uL (ref 150–400)
RBC: 4.51 MIL/uL (ref 4.22–5.81)
RDW: 13.3 % (ref 11.5–15.5)
WBC: 18.8 10*3/uL — ABNORMAL HIGH (ref 4.0–10.5)
nRBC: 0.2 % (ref 0.0–0.2)

## 2020-01-04 LAB — COMPREHENSIVE METABOLIC PANEL
ALT: 168 U/L — ABNORMAL HIGH (ref 0–44)
AST: 280 U/L — ABNORMAL HIGH (ref 15–41)
Albumin: 3.6 g/dL (ref 3.5–5.0)
Alkaline Phosphatase: 117 U/L (ref 38–126)
Anion gap: 19 — ABNORMAL HIGH (ref 5–15)
BUN: 19 mg/dL (ref 8–23)
CO2: 20 mmol/L — ABNORMAL LOW (ref 22–32)
Calcium: 9.4 mg/dL (ref 8.9–10.3)
Chloride: 104 mmol/L (ref 98–111)
Creatinine, Ser: 1.85 mg/dL — ABNORMAL HIGH (ref 0.61–1.24)
GFR calc Af Amer: 44 mL/min — ABNORMAL LOW (ref >60)
GFR calc non Af Amer: 38 mL/min — ABNORMAL LOW (ref >60)
Glucose, Bld: 126 mg/dL — ABNORMAL HIGH (ref 70–99)
Potassium: 6.4 mmol/L (ref 3.5–5.1)
Sodium: 143 mmol/L (ref 135–145)
Total Bilirubin: 0.7 mg/dL (ref 0.3–1.2)
Total Protein: 6.7 g/dL (ref 6.5–8.1)

## 2020-01-04 LAB — LIPASE, BLOOD: Lipase: 58 U/L — ABNORMAL HIGH (ref 11–51)

## 2020-01-04 LAB — GLUCOSE, CAPILLARY
Glucose-Capillary: 122 mg/dL — ABNORMAL HIGH (ref 70–99)
Glucose-Capillary: 139 mg/dL — ABNORMAL HIGH (ref 70–99)
Glucose-Capillary: 150 mg/dL — ABNORMAL HIGH (ref 70–99)
Glucose-Capillary: 164 mg/dL — ABNORMAL HIGH (ref 70–99)
Glucose-Capillary: 198 mg/dL — ABNORMAL HIGH (ref 70–99)
Glucose-Capillary: 202 mg/dL — ABNORMAL HIGH (ref 70–99)
Glucose-Capillary: 224 mg/dL — ABNORMAL HIGH (ref 70–99)
Glucose-Capillary: 227 mg/dL — ABNORMAL HIGH (ref 70–99)

## 2020-01-04 LAB — STREP PNEUMONIAE URINARY ANTIGEN: Strep Pneumo Urinary Antigen: POSITIVE — AB

## 2020-01-04 LAB — I-STAT CHEM 8, ED
BUN: 26 mg/dL — ABNORMAL HIGH (ref 8–23)
Calcium, Ion: 1.11 mmol/L — ABNORMAL LOW (ref 1.15–1.40)
Chloride: 109 mmol/L (ref 98–111)
Creatinine, Ser: 1.5 mg/dL — ABNORMAL HIGH (ref 0.61–1.24)
Glucose, Bld: 116 mg/dL — ABNORMAL HIGH (ref 70–99)
HCT: 43 % (ref 39.0–52.0)
Hemoglobin: 14.6 g/dL (ref 13.0–17.0)
Potassium: 5.2 mmol/L — ABNORMAL HIGH (ref 3.5–5.1)
Sodium: 140 mmol/L (ref 135–145)
TCO2: 18 mmol/L — ABNORMAL LOW (ref 22–32)

## 2020-01-04 LAB — HEPATIC FUNCTION PANEL
ALT: 237 U/L — ABNORMAL HIGH (ref 0–44)
AST: 508 U/L — ABNORMAL HIGH (ref 15–41)
Albumin: 3.6 g/dL (ref 3.5–5.0)
Alkaline Phosphatase: 159 U/L — ABNORMAL HIGH (ref 38–126)
Bilirubin, Direct: 0.8 mg/dL — ABNORMAL HIGH (ref 0.0–0.2)
Indirect Bilirubin: 1.5 mg/dL — ABNORMAL HIGH (ref 0.3–0.9)
Total Bilirubin: 2.3 mg/dL — ABNORMAL HIGH (ref 0.3–1.2)
Total Protein: 6.8 g/dL (ref 6.5–8.1)

## 2020-01-04 LAB — SALICYLATE LEVEL: Salicylate Lvl: <7 mg/dL — ABNORMAL LOW (ref 7.0–30.0)

## 2020-01-04 LAB — RAPID URINE DRUG SCREEN, HOSP PERFORMED
Amphetamines: NOT DETECTED
Barbiturates: NOT DETECTED
Benzodiazepines: NOT DETECTED
Cocaine: POSITIVE — AB
Opiates: POSITIVE — AB
Tetrahydrocannabinol: NOT DETECTED

## 2020-01-04 LAB — PHOSPHORUS
Phosphorus: 3 mg/dL (ref 2.5–4.6)
Phosphorus: 4.9 mg/dL — ABNORMAL HIGH (ref 2.5–4.6)

## 2020-01-04 LAB — ECHOCARDIOGRAM COMPLETE
Height: 69 in
Weight: 3040 oz

## 2020-01-04 LAB — PROTIME-INR
INR: 1.3 — ABNORMAL HIGH (ref 0.8–1.2)
INR: 1.6 — ABNORMAL HIGH (ref 0.8–1.2)
Prothrombin Time: 16.1 seconds — ABNORMAL HIGH (ref 11.4–15.2)
Prothrombin Time: 19.1 seconds — ABNORMAL HIGH (ref 11.4–15.2)

## 2020-01-04 LAB — TRIGLYCERIDES: Triglycerides: 67 mg/dL (ref <150)

## 2020-01-04 LAB — TROPONIN I (HIGH SENSITIVITY)
Troponin I (High Sensitivity): 82 ng/L — ABNORMAL HIGH (ref <18)
Troponin I (High Sensitivity): 186 ng/L (ref <18)
Troponin I (High Sensitivity): 389 ng/L (ref <18)

## 2020-01-04 LAB — APTT
aPTT: 39 seconds — ABNORMAL HIGH (ref 24–36)
aPTT: 33 seconds (ref 24–36)

## 2020-01-04 LAB — RESPIRATORY PANEL BY RT PCR (FLU A&B, COVID)
Influenza A by PCR: NEGATIVE
Influenza B by PCR: NEGATIVE
SARS Coronavirus 2 by RT PCR: NEGATIVE

## 2020-01-04 LAB — AMYLASE: Amylase: 147 U/L — ABNORMAL HIGH (ref 28–100)

## 2020-01-04 LAB — MRSA PCR SCREENING: MRSA by PCR: NEGATIVE

## 2020-01-04 LAB — MAGNESIUM
Magnesium: 2.2 mg/dL (ref 1.7–2.4)
Magnesium: 2.3 mg/dL (ref 1.7–2.4)
Magnesium: 2.4 mg/dL (ref 1.7–2.4)

## 2020-01-04 LAB — ACETAMINOPHEN LEVEL: Acetaminophen (Tylenol), Serum: <10 ug/mL — ABNORMAL LOW (ref 10–30)

## 2020-01-04 LAB — PROCALCITONIN: Procalcitonin: 0.46 ng/mL

## 2020-01-04 MED ORDER — CALCIUM GLUCONATE 10 % IV SOLN: 1.0000 g | Freq: Once | INTRAVENOUS | Status: DC

## 2020-01-04 MED ORDER — FENTANYL CITRATE (PF) 100 MCG/2ML IJ SOLN: 50.0000 ug | INTRAMUSCULAR | Status: DC | PRN

## 2020-01-04 MED ORDER — NOREPINEPHRINE 4 MG/250ML-% IV SOLN
0.0000 ug/min | INTRAVENOUS | Status: DC
  Administered 2020-01-05: 5 ug/min via INTRAVENOUS
  Filled 2020-01-04 (×2): qty 250

## 2020-01-04 MED ORDER — SODIUM CHLORIDE 0.9% FLUSH
10.0000 mL | Freq: Two times a day (BID) | INTRAVENOUS | Status: DC
  Administered 2020-01-04 – 2020-01-05 (×2): 10 mL
  Administered 2020-01-06: 30 mL
  Administered 2020-01-06 – 2020-01-09 (×6): 10 mL

## 2020-01-04 MED ORDER — SODIUM CHLORIDE 0.9 % IV SOLN
3.0000 g | Freq: Four times a day (QID) | INTRAVENOUS | Status: DC
  Administered 2020-01-04 – 2020-01-06 (×8): 3 g via INTRAVENOUS
  Filled 2020-01-04 (×2): qty 3
  Filled 2020-01-04: qty 8
  Filled 2020-01-04 (×2): qty 3
  Filled 2020-01-04: qty 8
  Filled 2020-01-04: qty 3
  Filled 2020-01-04: qty 8
  Filled 2020-01-04 (×3): qty 3

## 2020-01-04 MED ORDER — MIDAZOLAM BOLUS VIA INFUSION
2.0000 mg | INTRAVENOUS | Status: DC | PRN
  Filled 2020-01-04: qty 2

## 2020-01-04 MED ORDER — MIDAZOLAM HCL 2 MG/2ML IJ SOLN
2.0000 mg | Freq: Once | INTRAMUSCULAR | Status: DC
  Filled 2020-01-04: qty 2

## 2020-01-04 MED ORDER — ORAL CARE MOUTH RINSE
15.0000 mL | OROMUCOSAL | Status: DC
  Administered 2020-01-04 – 2020-01-09 (×53): 15 mL via OROMUCOSAL

## 2020-01-04 MED ORDER — SODIUM CHLORIDE 0.9 % IV BOLUS: 1000.0000 mL | Freq: Once | INTRAVENOUS | Status: DC

## 2020-01-04 MED ORDER — SODIUM CHLORIDE 0.9 % IV SOLN: Freq: Once | INTRAVENOUS | Status: AC

## 2020-01-04 MED ORDER — SODIUM BICARBONATE 8.4 % IV SOLN: 50.0000 meq | Freq: Once | INTRAVENOUS | Status: DC

## 2020-01-04 MED ORDER — HYDRALAZINE HCL 20 MG/ML IJ SOLN: 10.0000 mg | Freq: Once | INTRAMUSCULAR | Status: AC

## 2020-01-04 MED ORDER — MIDAZOLAM HCL 2 MG/2ML IJ SOLN
2.0000 mg | Freq: Once | INTRAMUSCULAR | Status: AC
  Administered 2020-01-04: 2 mg via INTRAVENOUS

## 2020-01-04 MED ORDER — POTASSIUM CHLORIDE 10 MEQ/50ML IV SOLN: 10.0000 meq | INTRAVENOUS | Status: DC

## 2020-01-04 MED ORDER — LACTATED RINGERS IV BOLUS
1000.0000 mL | Freq: Once | INTRAVENOUS | Status: AC
  Administered 2020-01-04: 1000 mL via INTRAVENOUS

## 2020-01-04 MED ORDER — CHLORHEXIDINE GLUCONATE 0.12% ORAL RINSE (MEDLINE KIT)
15.0000 mL | Freq: Two times a day (BID) | OROMUCOSAL | Status: DC
  Administered 2020-01-04 – 2020-01-09 (×10): 15 mL via OROMUCOSAL

## 2020-01-04 MED ORDER — NOREPINEPHRINE 4 MG/250ML-% IV SOLN
0.0000 ug/min | INTRAVENOUS | Status: DC
  Administered 2020-01-04: 10 ug/min via INTRAVENOUS

## 2020-01-04 MED ORDER — PIPERACILLIN-TAZOBACTAM 3.375 G IVPB 30 MIN
3.3750 g | Freq: Once | INTRAVENOUS | Status: AC
  Administered 2020-01-04: 3.375 g via INTRAVENOUS
  Filled 2020-01-04: qty 50

## 2020-01-04 MED ORDER — ATROPINE SULFATE 1 MG/10ML IJ SOSY
PREFILLED_SYRINGE | INTRAMUSCULAR | Status: AC
  Administered 2020-01-04: 1 mg
  Filled 2020-01-04: qty 10

## 2020-01-04 MED ORDER — PROPOFOL 1000 MG/100ML IV EMUL
INTRAVENOUS | Status: AC
  Filled 2020-01-04: qty 100

## 2020-01-04 MED ORDER — ATROPINE SULFATE 1 MG/ML IJ SOLN: 1.0000 mg | Freq: Once | INTRAMUSCULAR | Status: DC

## 2020-01-04 MED ORDER — SODIUM CHLORIDE 0.9 % IV SOLN: INTRAVENOUS | Status: DC

## 2020-01-04 MED ORDER — SODIUM ZIRCONIUM CYCLOSILICATE 5 G PO PACK
5.0000 g | PACK | Freq: Once | ORAL | Status: DC
  Filled 2020-01-04: qty 1

## 2020-01-04 MED ORDER — LACTULOSE 10 GM/15ML PO SOLN
20.0000 g | Freq: Two times a day (BID) | ORAL | Status: AC
  Administered 2020-01-04: 20 g
  Filled 2020-01-04: qty 30

## 2020-01-04 MED ORDER — FENTANYL CITRATE (PF) 100 MCG/2ML IJ SOLN
100.0000 ug | Freq: Once | INTRAMUSCULAR | Status: AC
  Administered 2020-01-04: 100 ug via INTRAVENOUS

## 2020-01-04 MED ORDER — CISATRACURIUM BOLUS VIA INFUSION
0.1000 mg/kg | Freq: Once | INTRAVENOUS | Status: DC
  Filled 2020-01-04: qty 9

## 2020-01-04 MED ORDER — PRO-STAT SUGAR FREE PO LIQD
30.0000 mL | Freq: Two times a day (BID) | ORAL | Status: DC
  Administered 2020-01-04 – 2020-01-08 (×8): 30 mL
  Filled 2020-01-04 (×7): qty 30

## 2020-01-04 MED ORDER — FENTANYL 2500MCG IN NS 250ML (10MCG/ML) PREMIX INFUSION
100.0000 ug/h | INTRAVENOUS | Status: DC
  Administered 2020-01-04 – 2020-01-05 (×2): 100 ug/h via INTRAVENOUS
  Filled 2020-01-04 (×2): qty 250

## 2020-01-04 MED ORDER — SODIUM CHLORIDE 0.9% FLUSH: 10.0000 mL | INTRAVENOUS | Status: DC | PRN

## 2020-01-04 MED ORDER — CISATRACURIUM BOLUS VIA INFUSION
0.0500 mg/kg | INTRAVENOUS | Status: DC | PRN
  Filled 2020-01-04: qty 5

## 2020-01-04 MED ORDER — VITAL HIGH PROTEIN PO LIQD
1000.0000 mL | ORAL | Status: DC
  Administered 2020-01-04: 1000 mL

## 2020-01-04 MED ORDER — NOREPINEPHRINE 4 MG/250ML-% IV SOLN: 0.0000 ug/min | INTRAVENOUS | Status: DC

## 2020-01-04 MED ORDER — PROPOFOL 1000 MG/100ML IV EMUL
0.0000 ug/kg/min | INTRAVENOUS | Status: DC
  Administered 2020-01-04: 10 ug/kg/min via INTRAVENOUS

## 2020-01-04 MED ORDER — ARTIFICIAL TEARS OPHTHALMIC OINT
1.0000 "application " | TOPICAL_OINTMENT | Freq: Three times a day (TID) | OPHTHALMIC | Status: DC
  Administered 2020-01-04 – 2020-01-05 (×4): 1 via OPHTHALMIC
  Filled 2020-01-04 (×2): qty 3.5

## 2020-01-04 MED ORDER — FENTANYL CITRATE (PF) 100 MCG/2ML IJ SOLN: 100.0000 ug | Freq: Once | INTRAMUSCULAR | Status: DC

## 2020-01-04 MED ORDER — SODIUM BICARBONATE 8.4 % IV SOLN
INTRAVENOUS | Status: AC
  Filled 2020-01-04: qty 50

## 2020-01-04 MED ORDER — FENTANYL BOLUS VIA INFUSION
50.0000 ug | INTRAVENOUS | Status: DC | PRN
  Filled 2020-01-04: qty 50

## 2020-01-04 MED ORDER — HYDRALAZINE HCL 20 MG/ML IJ SOLN
INTRAMUSCULAR | Status: AC
  Administered 2020-01-04: 10 mg via INTRAVENOUS
  Filled 2020-01-04: qty 1

## 2020-01-04 MED ORDER — CHLORHEXIDINE GLUCONATE CLOTH 2 % EX PADS
6.0000 | MEDICATED_PAD | Freq: Every day | CUTANEOUS | Status: DC
  Administered 2020-01-05 – 2020-01-09 (×5): 6 via TOPICAL

## 2020-01-04 MED ORDER — POTASSIUM CHLORIDE 10 MEQ/50ML IV SOLN
10.0000 meq | INTRAVENOUS | Status: AC
  Administered 2020-01-04 – 2020-01-05 (×4): 10 meq via INTRAVENOUS
  Filled 2020-01-04 (×4): qty 50

## 2020-01-04 MED ORDER — ASPIRIN 300 MG RE SUPP
300.0000 mg | RECTAL | Status: AC
  Administered 2020-01-04: 300 mg via RECTAL
  Filled 2020-01-04: qty 1

## 2020-01-04 MED ORDER — PIPERACILLIN-TAZOBACTAM 3.375 G IVPB: 3.3750 g | Freq: Three times a day (TID) | INTRAVENOUS | Status: DC

## 2020-01-04 MED ORDER — MIDAZOLAM 50MG/50ML (1MG/ML) PREMIX INFUSION
2.0000 mg/h | INTRAVENOUS | Status: DC
  Administered 2020-01-04: 4 mg/h via INTRAVENOUS
  Administered 2020-01-04: 2 mg/h via INTRAVENOUS
  Filled 2020-01-04 (×3): qty 50

## 2020-01-04 MED ORDER — SODIUM CHLORIDE 0.9 % IV SOLN
1.0000 ug/kg/min | INTRAVENOUS | Status: DC
  Administered 2020-01-04: 1 ug/kg/min via INTRAVENOUS
  Filled 2020-01-04 (×2): qty 20

## 2020-01-04 MED ORDER — PANTOPRAZOLE SODIUM 40 MG PO PACK
40.0000 mg | PACK | Freq: Every day | ORAL | Status: DC
  Administered 2020-01-04 – 2020-01-08 (×5): 40 mg
  Filled 2020-01-04 (×5): qty 20

## 2020-01-04 MED ORDER — HEPARIN SODIUM (PORCINE) 5000 UNIT/ML IJ SOLN
5000.0000 [IU] | Freq: Three times a day (TID) | INTRAMUSCULAR | Status: DC
  Administered 2020-01-04 – 2020-01-08 (×12): 5000 [IU] via SUBCUTANEOUS
  Filled 2020-01-04 (×10): qty 1

## 2020-01-04 MED ORDER — INSULIN ASPART 100 UNIT/ML ~~LOC~~ SOLN
2.0000 [IU] | SUBCUTANEOUS | Status: DC
  Administered 2020-01-04: 4 [IU] via SUBCUTANEOUS
  Administered 2020-01-04 (×2): 2 [IU] via SUBCUTANEOUS
  Administered 2020-01-04: 4 [IU] via SUBCUTANEOUS
  Administered 2020-01-05 – 2020-01-08 (×12): 2 [IU] via SUBCUTANEOUS

## 2020-01-04 NOTE — Progress Notes (Signed)
Pt. Arrived to 2H15, at 33 degrees, intubated, LSC CVC,   O: vitals stable, hypertensive-->hydralazine ABG reviewed, decreased FiO2, RR, BMP with K=6.6; lokalma given in ED, likely still high, will give NGT lactulose as well, recheck labs at 11:30 a.m. treat as needed EXAM: GEN: NAD HEAD: AT/Coffee CV: RRR no r/m/g PULM: CTAB on vent ABD: SND GU: foley in place NEURO: pupils 30mm, nonreactive, midline, no corneal reflex, no gag or cough, no dolls eyes  A: cardiac arrest likely related to heroin, cocaine OD, ROSC prolonged, CTH nl but neuro exam not reassuring.    P:  1) continue hypothermia with sedation/muscle relaxation 2) Likely re-CT head in 24-48 hours 3) continue to correct hyperkalemia, monitor for end-organ damage/reperfusion injury. 4) vent management   I have independently seen and examined the patient, reviewed data, and developed an assessment and plan. A total of 42 minutes were spent in critical care assessment and medical decision making. This critical care time does not reflect procedure time, or teaching time or supervisory time of PA/NP/Med student/Med Resident, etc but could involve care discussion time.  Gwynne Edinger, MD PhD 16-Jan-2020 10:31 AM

## 2020-01-04 NOTE — ED Triage Notes (Signed)
Patient from home, found unresponsive, pulseless and apneic.  Patient is a known heroin user.  Patient had pinpoint pupils.  Initial rhythm was asystole.  Patient was intubated with 7.0.  Patient was found with vomitus in airway and suctioned for copious amounts.  Patient had 15 mins of CPR, possible downtime of up to 2 hours.  3 Rounds of epi.  Patient on epi drip upon arrival to ED.  of fluid given.  Patient now NSR rate of 60, pressure of 60/40, strong carotid pulse.

## 2020-01-04 NOTE — Progress Notes (Addendum)
Pharmacy Antibiotic Note  Devin Gray is a 64 y.o. male admitted on January 05, 2020 s/p cardiac arrest, vomit in airway >> concern for aspiration pneumonia.  Pharmacy has been consulted for Unasyn dosing.  Plan: Unasyn 3g IV Q6H.  Height: 5\' 9"  (175.3 cm) Weight: 190 lb (86.2 kg) IBW/kg (Calculated) : 70.7  Temp (24hrs), Avg:94.3 F (34.6 C), Min:93.6 F (34.2 C), Max:94.9 F (34.9 C)  Recent Labs  Lab 01-05-20 0509 01-05-20 0527  WBC 18.8*  --   CREATININE  --  1.50*    Estimated Creatinine Clearance: 54.1 mL/min (A) (by C-G formula based on SCr of 1.5 mg/dL (H)).    No Known Allergies   Thank you for allowing pharmacy to be a part of this patient's care.  01/06/20, PharmD, BCPS  01/05/20 6:00 AM

## 2020-01-04 NOTE — ED Notes (Signed)
Pads on patient

## 2020-01-04 NOTE — Progress Notes (Signed)
Brief Nutrition Note  Consult received for enteral/tube feeding initiation and management.  Adult Enteral Nutrition Protocol initiated. Full assessment to follow.  Admitting Dx: Cardiac arrest (HCC) [I46.9] Endotracheal tube present [Z97.8]  Body mass index is 28.06 kg/m. Pt meets criteria for overweight based on current BMI.  Labs:  Recent Labs  Lab 01/05/2020 0509 01/02/2020 0509 12/17/2019 0527 12/28/2019 0527 12/22/2019 0531 01/11/2020 0723 01/01/2020 0921  NA 143   < > 140   < > 140 140 137  K 6.4*   < > 5.2*   < > 5.9* 6.6* 4.7  CL 104  --  109  --   --  103  --   CO2 20*  --   --   --   --  20*  --   BUN 19  --  26*  --   --  23  --   CREATININE 1.85*  --  1.50*  --   --  1.58*  --   CALCIUM 9.4  --   --   --   --  7.9*  --   GLUCOSE 126*  --  116*  --   --  140*  --    < > = values in this interval not displayed.    Devin Gavia, MS, RD, LDN RD pager number and weekend/on-call pager number located in [place].

## 2020-01-04 NOTE — Progress Notes (Signed)
Echocardiogram 2D Echocardiogram has been performed.  Devin Gray 12/31/2019, 2:33 PM

## 2020-01-04 NOTE — Progress Notes (Signed)
[place] Progress Note Patient Name: Devin Gray DOB: 1956/07/29 MRN: [id number]   Date of Service  01/08/2020  HPI/Events of Note  K 2.4. eGFR 40s. High stool/diarrheal output.   eICU Interventions  Rx 78mq KCl via central line.     Intervention Category Major Interventions: Electrolyte abnormality - evaluation and management  RCharlott Rakes3/21/2021, 9:17 PM

## 2020-01-04 NOTE — Procedures (Signed)
Arterial Catheter Insertion Procedure Note Devin Gray [id number] November 27, 1955  Procedure: Insertion of Arterial Catheter  Indications: Blood pressure monitoring and Frequent blood sampling  Procedure Details Consent: Unable to obtain consent because of emergent medical necessity. Time Out: Verified patient identification, verified procedure, site/side was marked, verified correct patient position, special equipment/implants available, medications/allergies/relevent history reviewed, required imaging and test results available.  Performed  Maximum sterile technique was used including antiseptics, cap, gloves, gown, hand hygiene, mask and sheet. Skin prep: Chlorhexidine; local anesthetic administered 20 gauge catheter was inserted into right radial artery using the Seldinger technique. ULTRASOUND GUIDANCE USED: NO Evaluation Blood flow good; BP tracing good. Complications: No apparent complications.   Morley Kos 12/25/2019

## 2020-01-04 NOTE — Progress Notes (Signed)
Vent changes per Dr. Ollen Bowl post ABG results.

## 2020-01-04 NOTE — ED Provider Notes (Addendum)
[facility] EMERGENCY DEPARTMENT Provider Note   CSN: [id number] Arrival date & time: 01-08-20  0457     History Chief Complaint  Patient presents with  . Cardiac Arrest    Devin Gray is a 64 y.o. male.  Level 5 caveat for unresponsive.  Patient brought in post CPR by EMS.  He was found to be pulseless and apneic by family members.  Is a known heroin abuser but did not have any witnessed use today.  Was given Narcan by his family members and CPR was started.  EMS reports initial rhythm was asystole.  Copious vomit in airway.  Received 15 minutes of CPR after possible downtime up to 2 hours.  Received 3 rounds of epinephrine.  No shocks.  Arrived with epinephrine drip in process.  EMS was activated code STEMI for possible elevation anteriorly.  Patient unresponsive.  Discussed with patient's brother Oletta Lamas by phone.  [phone].  Feliz Beam states patient is known to abuse heroin but did not see him use any.  He was found to be unresponsive in a chair.  He believes he may have been there for about 1 hour.  Did not respond to Narcan. Feliz Beam denies any other drug use other than heroin and cocaine. Patient has no other medical problems per Feliz Beam.  The history is provided by the patient and the EMS personnel. The history is limited by the condition of the patient.       History reviewed. No pertinent past medical history.  There are no problems to display for this patient.   History reviewed. No pertinent surgical history.     No family history on file.  Social History   Tobacco Use  . Smoking status: Not on file  Substance Use Topics  . Alcohol use: Not on file  . Drug use: Not on file    Home Medications Prior to Admission medications   Not on File    Allergies    Patient has no known allergies.  Review of Systems   Review of Systems  Unable to perform ROS: Patient unresponsive    Physical Exam Updated Vital Signs BP (!) 135/112    Pulse (!) 57   Resp 15   Ht 5\' 9"  (1.753 m)   Wt 86.2 kg   SpO2 100%   BMI 28.06 kg/m   Physical Exam Vitals and nursing note reviewed.  Constitutional:      General: He is in acute distress.     Appearance: He is well-developed. He is ill-appearing.     Comments: unresponsive  HENT:     Head: Normocephalic and atraumatic.     Mouth/Throat:     Pharynx: No oropharyngeal exudate.  Eyes:     Comments: Pupils fixed and dilated  Neck:     Comments: No meningismus. Cardiovascular:     Heart sounds: Normal heart sounds. No murmur.  Pulmonary:     Effort: No respiratory distress.     Comments: Equal breath sounds with bagging. Abdominal:     Palpations: Abdomen is soft.     Comments: Reducible inguinal hernia on right  Musculoskeletal:        General: No tenderness. Normal range of motion.  Skin:    General: Skin is warm.  Neurological:     Motor: No abnormal muscle tone.     Comments: unresponsive     ED Results / Procedures / Treatments   Labs (all labs ordered are listed, but only abnormal results  are displayed) Labs Reviewed  CBC WITH DIFFERENTIAL/PLATELET - Abnormal; Notable for the following components:      Result Value   WBC 18.8 (*)    Neutro Abs 13.8 (*)    Abs Immature Granulocytes 0.86 (*)    All other components within normal limits  COMPREHENSIVE METABOLIC PANEL - Abnormal; Notable for the following components:   Potassium 6.4 (*)    CO2 20 (*)    Glucose, Bld 126 (*)    Creatinine, Ser 1.85 (*)    GFR calc non Af Amer 38 (*)    GFR calc Af Amer 44 (*)    Anion gap 19 (*)    All other components within normal limits  ACETAMINOPHEN LEVEL - Abnormal; Notable for the following components:   Acetaminophen (Tylenol), Serum <10 (*)    All other components within normal limits  SALICYLATE LEVEL - Abnormal; Notable for the following components:   Salicylate Lvl <7.0 (*)    All other components within normal limits  RAPID URINE DRUG SCREEN, HOSP  PERFORMED - Abnormal; Notable for the following components:   Opiates POSITIVE (*)    Cocaine POSITIVE (*)    All other components within normal limits  I-STAT CHEM 8, ED - Abnormal; Notable for the following components:   Potassium 5.2 (*)    BUN 26 (*)    Creatinine, Ser 1.50 (*)    Glucose, Bld 116 (*)    Calcium, Ion 1.11 (*)    TCO2 18 (*)    All other components within normal limits  POCT I-STAT 7, (LYTES, BLD GAS, ICA,H+H) - Abnormal; Notable for the following components:   pH, Arterial 7.079 (*)    pCO2 arterial 65.7 (*)    pO2, Arterial 112.0 (*)    Acid-base deficit 12.0 (*)    Potassium 5.9 (*)    All other components within normal limits  TROPONIN I (HIGH SENSITIVITY) - Abnormal; Notable for the following components:   Troponin I (High Sensitivity) 82 (*)    All other components within normal limits  RESPIRATORY PANEL BY RT PCR (FLU A&B, COVID)  BRAIN NATRIURETIC PEPTIDE  POTASSIUM  I-STAT ARTERIAL BLOOD GAS, ED  TROPONIN I (HIGH SENSITIVITY)    EKG None  Radiology CT Head Wo Contrast  Result Date: 12/30/2019 CLINICAL DATA:  Cardiac arrest. Additional history provided: Patient found unresponsive by friends EXAM: CT HEAD WITHOUT CONTRAST TECHNIQUE: Contiguous axial images were obtained from the base of the skull through the vertex without intravenous contrast. COMPARISON:  Head CT 02/23/2018 FINDINGS: Brain: The examination is mildly motion degraded. There is no evidence of acute intracranial hemorrhage, intracranial mass, midline shift or extra-axial fluid collection.Redemonstrated small focus of chronic encephalomalacia within the anterior left frontal lobe Vascular: No hyperdense vessel. Skull: Normal. Negative for fracture or focal lesion. Sinuses/Orbits: Severe pansinusitis with air-fluid levels. Visualized orbits demonstrate no acute abnormality. Prominent secretions within the nasal passages and nasopharynx. IMPRESSION: 1. Motion degraded exam. 2. No evidence of  acute intracranial abnormality. 3. Redemonstrated small region of chronic left frontal lobe encephalomalacia. 4. Severe pansinusitis with air-fluid levels. 5. Prominent secretions within the nasal passages and nasopharynx. Electronically Signed   By: Jackey Loge DO   On: 01/06/2020 07:52   DG Chest Portable 1 View  Result Date: 01/02/2020 CLINICAL DATA:  Verify placement of central line. EXAM: PORTABLE CHEST 1 VIEW COMPARISON:  Chest radiograph performed earlier the same day 01/12/2020 FINDINGS: Interval placement of a left-sided approach central venous catheter with tip  projecting in the region of the caudal SVC. ET tube terminates just below the level of the clavicular heads. Enteric tube passes below the level left hemidiaphragm with tip excluded from the field of view. Heart size within normal limits. Aortic atherosclerosis. Improved aeration of the left lung base. No evidence of airspace consolidation within the lungs. No evidence of pleural effusion or pneumothorax. No acute bony abnormality is identified. IMPRESSION: Interval placement of a left-sided approach central venous catheter with tip projecting in the region of the caudal SVC. Additional support apparatus as described. Improved aeration of the left lung base. No evidence of airspace consolidation within the lungs. Electronically Signed   By: Jackey Loge DO   On: 12/31/2019 07:06   DG Chest Portable 1 View  Result Date: 12/17/2019 CLINICAL DATA:  Patient found unresponsive. Post CPR. Intubation. EXAM: PORTABLE CHEST 1 VIEW COMPARISON:  November 18, 2019 FINDINGS: A transcutaneous pacer lead overlies the left side of the mediastinum and medial left lung. An ETT is in good position. An NG tube terminates below today's film. No pneumothorax identified. Left retrocardiac opacity with obscuration left hemidiaphragm is identified. The lungs are otherwise clear. No nodules or masses. The heart, hila, and mediastinum are unremarkable. No obvious rib  fractures identified. IMPRESSION: 1. Support apparatus as above. 2. Left retrocardiac opacity could represent atelectasis, infiltrate, or aspiration. Recommend follow-up to resolution. Electronically Signed   By: Gerome Sam III M.D   On: 12/27/2019 05:44    Procedures .Central Line  Date/Time: 12/16/2019 7:04 AM Performed by: Glynn Octave, MD Authorized by: Glynn Octave, MD   Consent:    Consent obtained:  Emergent situation   Risks discussed:  Arterial puncture, bleeding, nerve damage, incorrect placement, pneumothorax and infection   Alternatives discussed:  No treatment Pre-procedure details:    Hand hygiene: Hand hygiene performed prior to insertion     Sterile barrier technique: All elements of maximal sterile technique followed     Skin preparation:  2% chlorhexidine   Skin preparation agent: Skin preparation agent completely dried prior to procedure   Anesthesia (see MAR for exact dosages):    Anesthesia method:  Local infiltration   Local anesthetic:  Lidocaine 1% w/o epi Procedure details:    Location:  L subclavian   Patient position:  Trendelenburg   Procedural supplies:  Triple lumen   Catheter size:  7 Fr   Landmarks identified: yes     Ultrasound guidance: no     Number of attempts:  1   Successful placement: yes   Post-procedure details:    Post-procedure:  Dressing applied and line sutured   Assessment:  Blood return through all ports, free fluid flow, placement verified by x-ray and no pneumothorax on x-ray   Patient tolerance of procedure:  Tolerated well, no immediate complications .Critical Care Performed by: Glynn Octave, MD Authorized by: Glynn Octave, MD   Critical care provider statement:    Critical care time (minutes):  60   Critical care was necessary to treat or prevent imminent or life-threatening deterioration of the following conditions:  Respiratory failure, shock and dehydration   Critical care was time spent personally by  me on the following activities:  Discussions with consultants, evaluation of patient's response to treatment, examination of patient, ordering and performing treatments and interventions, ordering and review of laboratory studies, ordering and review of radiographic studies, pulse oximetry, re-evaluation of patient's condition, obtaining history from patient or surrogate and review of old charts   (including critical  care time)  Medications Ordered in ED Medications  norepinephrine (LEVOPHED) 4mg  in 265mL premix infusion (has no administration in time range)  0.9 %  sodium chloride infusion (has no administration in time range)  sodium chloride 0.9 % bolus 1,000 mL (has no administration in time range)  norepinephrine (LEVOPHED) 4mg  in 225mL premix infusion (has no administration in time range)    ED Course  I have reviewed the triage vital signs and the nursing notes.  Pertinent labs & imaging results that were available during my care of the patient were reviewed by me and considered in my medical decision making (see chart for details).    MDM Rules/Calculators/A&P                      Post CPR. Suspected opiate overdose.  Patient arrives unresponsive.  EKG on arrival sinus rhythm without acute ST elevation.  EMS EKG reviewed with Dr. Radford Pax of cardiology.  She agrees does not meet STEMI criteria.  She feels EMS EKG may have been atrial flutter with anterior Q waves  EMS endotracheal tube confirmed by direct visualization.  Equal breath sounds bilaterally.  Patient unresponsive with fixed and dilated pupils.  Patient given ice packs to his bilateral groins and axillae.  Obtain labs, chest x-ray and EKG and CT head Drug screen positive for cocaine and opiates.  Patient remains mostly nonresponsive. He does have some myoclonus but no corneal reflex no gag reflex. Labs show mild hyperkalemia of 6.4 which will be repeated as this is likely hemolyzed. Patient given bicarb and calcium  and lokelma.  Did require atropine and bicarb for bradycardia in the 30s and 40s. ABG with mixed acidosis.  Discussed with critical care at bedside Dr. Chase Caller.  Central line was placed by myself for cooling protocol. CT head pending.  Central line appropriate for use.  Patient starting to have some jerking and gagging on tube.  Will start sedation. Patient to be admitted to ICU.  Family updated.   ED ECG REPORT   Date: 12/17/2019  Rate: 59  Rhythm: normal sinus rhythm  QRS Axis: normal  Intervals: normal  ST/T Wave abnormalities: normal  Conduction Disutrbances:none  Narrative Interpretation:   Old EKG Reviewed: none available  I have personally reviewed the EKG tracing and agree with the computerized printout as noted.  Final Clinical Impression(s) / ED Diagnoses Final diagnoses:  Cardiac arrest [facility])    Rx / Vance Orders ED Discharge Orders    None       Reine Bristow, Annie Main, MD 12/21/2019 Gloriajean Dell    Ezequiel Essex, MD 12/25/2019 [phone]

## 2020-01-04 NOTE — Procedures (Signed)
ELECTROENCEPHALOGRAM REPORT   Patient: Devin Gray       Room #: [facility] EEG No. ID: [id number] Age: 64 y.o.        Sex: male Requesting Physician: Ollen Bowl Report Date:  12/31/2019        Interpreting Physician: Thana Farr  History: Devin Gray is an 64 y.o. male found down  Medications:  Nimbex, Versed, Fentanyl, Diprovan, Unasyn  Conditions of Recording:  This is a 21 channel routine scalp EEG performed with bipolar and monopolar montages arranged in accordance to the international 10/20 system of electrode placement. One channel was dedicated to EKG recording.  The patient is in the intubated, sedated and paralyzed state.  Description:  The background activity is discontinuous. It is markedly attenuated for the majority of the recording diffusely.  There are generalized, high voltage bursts of polyspike, spike and slow wave activity lasting 1-2 seconds that occur intermittently.  For the most part these bursts occur every 2-3 minutes but on rare occasions can be as frequent as every 6 seconds.   There is no change in clinical activity with these bursts.  This discontinuous activity is maintained throughout the tracing.  Hyperventilation and intermittent photic stimulation were not performed.   IMPRESSION: This is an abnormal EEG due to a burst-suppression pattern seen throughout the tracing.  Burst suppression pattern can be seen in a variety of circumstances, including anesthesia, drug intoxication, hypothermia, as well as cerebral anoxia.  Clinical/neurological, and radiographic correlation advised.     Thana Farr, MD Neurology [phone] 12/23/2019, 12:50 PM

## 2020-01-04 NOTE — Progress Notes (Signed)
Critical ABG values given to Dr. Ollen Bowl. FIO2 increased to 70% and Peep increased to 8 per his verbal order.

## 2020-01-04 NOTE — Progress Notes (Signed)
Stool liquidy leaking around flexiseal, cleaned repositioned peri and flowy care done, pulledsterile wate rout of flexiseal repositioned and then balloon blown up again.   Still leaking. Cleaned again . Towel places in between legs of patient to catch any leaking stool.

## 2020-01-04 NOTE — Progress Notes (Signed)
Father called back updated on patient, why he was in the hosptial , now on mechanical ventilation. Cooling. He will get numbers of daughters and call back.  Patient has 2 daughters. As far as father knows, he has no underlying  Or chronic conditions, had one surgery at 18, arm pinning. He did say that he  Has been on drugs  He is divorced.

## 2020-01-04 NOTE — Progress Notes (Signed)
No family here, no belongings except clothes and 1 pack of cigarettes

## 2020-01-04 NOTE — Progress Notes (Signed)
EEG complete - results pending 

## 2020-01-04 NOTE — Progress Notes (Signed)
Spoke to daughter Devin Gray, she will be main decider along with sisiter They have established passcode.  .  Man called, stated he was the [facility], patient was staying at his house when the cardiac  arrest occurred. Mr Raul Del cannot produce POA paperwork.  We told him we cxannot give him information.  Will pass his number along to the family, as he has the patients belongings.  His number is [phone]

## 2020-01-04 NOTE — Plan of Care (Signed)
Admitted today with cardiac arrest code cool. Prior to cooling- patient was a GCS 3 pupils 9 and nonreactive. Aptient had no gag, no cough, nonresponsive. Has initiation of breathing.   Nsg Diagnosis Ineffective prtoection of airway due to poor mentation  Interventions Maintain adequete airway Protect ETT Ensure adequete ventilation and positioning of ETT  Assess l;ing sounds Q 4H, document and report any chages  Monitor o2 saturations and ANG Collaborate with RT on interventions such as suctioning, O2 increases Ventilator bundle mouth care Q2, HOB elevated

## 2020-01-04 NOTE — H&P (Signed)
NAME:  PRENTISS POLIO, MRN:  [id number], DOB:  [birth year], LOS: 0 ADMISSION DATE:  12/29/2019, CONSULTATION DATE:  12/27/2019 REFERRING MD:  Dr Manus Gunning, CHIEF COMPLAINT:  Cardiac arrest,  Suspected heron OD   Brief History   Cardiac arrest - > suspected Heroin (best friend Oletta Lamas [phone] - a recovering addict)  History of present illness    64 year old male.  History is obtained from 30-year best friend and self declared healthcare decision maker Mr. Oletta Lamas.  Mr. Eliberto Ivory himself is a recovering addict.  He says that patient vacated his apartment on [address] 2 days ago and has been living with Mr. Eliberto Ivory at [address]  On 01/03/2020 patient had been out all day.  Then later in the evening Mr. Austin and the patient watch TV together.  Late into the night Mr. Austin fell asleep and then last seen patient to be normal around 2 AM.  An hour or 2 later when Mr. Eliberto Ivory woke up he found the patient Mr. Kacin Dancy slumped in his loveseat with vomit over him and gray and pulseless.  Immediate CPR was started.  EMS was called EMS arrived within 10 minutes and another 15 minutes of CPR was continued.  According to Dr. Manus Gunning patient was asystole on EMS arrival.  Received epi x3.  Patient was brought in on a epinephrine infusion that was turned off in the emergency department.  In the emergency department normal blood pressure but bradycardic with a heart rate of 30 and sinus.  pH 7.0 patient's unresponsive with fixed and dilated pupils but did seem to have spontaneous respiration.  Mr. Eliberto Ivory reports full code for the moment.  Mr. Eliberto Ivory suspects patient has overdosed on heroin and possibly cocaine.  CT head is pending at the time of CCM evaluation.  Significant amount of OG drainage through the OG tube.   Past Medical History    has no past medical history on file.   has no history on file for tobacco.  History reviewed. No pertinent surgical history.  No Known  Allergies   There is no immunization history on file for this patient.  No family history on file.   Current Facility-Administered Medications:  .  norepinephrine (LEVOPHED) 4mg  in premix infusion, 0-40 mcg/min, Intravenous, Continuous, Rancour, Stephen, MD, Stopped at 12/24/2019 0604 .  piperacillin-tazobactam (ZOSYN) IVPB 3.375 g, 3.375 g, Intravenous, Once, Rancour, Stephen, MD, Last Rate: 100 mL/hr at 12/30/2019 0620, 3.375 g at 12/22/2019 0620 .  piperacillin-tazobactam (ZOSYN) IVPB 3.375 g, 3.375 g, Intravenous, Q8H, Bryk, Veronda P, RPH .  sodium bicarbonate 1 mEq/mL injection, , , ,  .  sodium bicarbonate injection 50 mEq, 50 mEq, Intravenous, Once, 0621, MD No current outpatient medications on file.    Significant Hospital Events   01/09/2020 - admit  Consults:  x  Procedures:  12/26/2019 - ETT EMS 12/19/2019 - CVL by ER  Significant Diagnostic Tests:  01/09/2020 - CT Head  Micro Data:  x  Antimicrobials:    3/21 Zosyn x 1 3/21  Unasyn   Interim history/subjective:  01/03/2020 - seen in ER by CCM   Objective   Blood pressure (!) 169/92, pulse (!) 44, temperature (!) 93.6 F (34.2 C), temperature source Rectal, resp. rate 20, height 5\' 9"  (1.753 m), weight 86.2 kg, SpO2 99 %.    Vent Mode: PRVC FiO2 (%):  [100 %] 100 % Set Rate:  [14 bmp-20 bmp] 20 bmp Vt Set:  [  560 mL] 560 mL PEEP:  [5 cmH20] 5 cmH20 Plateau Pressure:  [19 cmH20] 19 cmH20  No intake or output data in the 24 hours ending 01/20/20 0638 Filed Weights   20-Jan-2020 0511  Weight: 86.2 kg    Examination: General: Well-built muscular male lying in the stretcher in emergency room trauma bay HENT: Pupils fixed and dilated.  No corneals.  No gag Lungs: Synchronous with the ventilator.  Has occasional spontaneous breath Cardiovascular: Bradycardic normal heart sounds Abdomen: Soft nontender no organomegaly no rigidity Extremities: No cyanosis no clubbing no edema Neuro:  Comatose GU: Looks normal  Resolved Hospital Problem list   X  Assessment & Plan:  ASSESSMENT / PLAN:   A:  Acute resp failure following cardiac arrest and subsequent aspiration    P:   PRVC Hob > 30 VAP bundle aBg at 9am   A:   Post cardiac arrest coma - meets criteria for induced hypothermia  Inclusion criteria:  * Unresponsive neurological status  * Age >/= 16  * ROSC within 45 minutes of arrest if initial ecg rhythm VT / VF  * ROSC within 30 minutes of arrest if initial ecg rhythm PEA or Asystole   Absolute exclusion criteria:  * Comatose status secondary to other cause  * Cardiac arrest with down-time (i.e. no CPR) > 15 minutes  * Hypothermia (temperature <32 C) at time of arrest or ED arrival  * If initial ECG rhythm PEA / Asystole, ROSC occurring > 30 min after initial arrest  * If initial ECG rhythm VT/VF, ROSC occurring > 45 minutes after initial arrest  * Known terminal illness unrelated to cardiac arrest event   P:   INduced hypothermia (meets criteria) CT head EEG     A:   Normotensive  P:  MAP goal > 85   A: No known cardiac issues but has hx of cocaine use  P: ECHO   A: Sinus brady   P: Monitor Correct acidosis   A:   Aspiration of vomitus P:   Unasyn   A:  Acidosis -s evere (metabolic and resp) AKI P:  Hydrate Maintain MAP Bic bolus x 1 and recheck abg at 9am -> bic gtt if needed   A:  hyperkalemia P: Lokelma Correct depending on phase of induced hypothermia     A:   Vomit  P:   Check lipase, amylase, LFT OG tube to LIS  HEMATOLOGIC   - HEME A:  At risk anemia of critical illnes   P:  - PRBC for hgb </= 6.9gm%    - exceptions are   -  if ACS susepcted/confirmed then transfuse for hgb </= 8.0gm%,  or    -  active bleeding with hemodynamic instability, then transfuse regardless of hemoglobin value   At at all times try to transfuse 1 unit prbc as possible with exception of active  hemorrhage    A At risk low platelets  P Monitor with dvt proph   A:   At risk hypo/hyperglycema   P:   ssi     Best practice:  Diet: TF Pain/Anxiety/Delirium protocol (if indicated): PAD protocol with paralyss VAP protocol (if indicated): yes DVT prophylaxis: lovenox/heparin GI prophylaxis: ppio Glucose control: ssi Mobility: bed rest Code Status: full code Family Communication: Elicia Lamp - updated Disposition: ER to ICU  D/w DR Rancour  ATTESTATION & SIGNATURE   The patient AMORY SIMONETTI is critically ill with multiple organ systems failure and requires high complexity decision  making for assessment and support, frequent evaluation and titration of therapies, application of advanced monitoring technologies and extensive interpretation of multiple databases.   Critical Care Time devoted to patient care services described in this note is  75  Minutes. This time reflects time of care of this signee Dr Kalman Shan. This critical care time does not reflect procedure time, or teaching time or supervisory time of PA/NP/Med student/Med Resident etc but could involve care discussion time     Dr. Kalman Shan, M.D., [facility].C.P Pulmonary and Critical Care Medicine Staff Physician [facility]  Pulmonary and Critical Care Pager: [phone], If no answer or between  15:00h - 7:00h: call [phone]  12/31/2019 6:38 AM     LABS    PULMONARY Recent Labs  Lab 01/03/2020 0527 09-Jan-2020 0531  PHART  --  7.079*  PCO2ART  --  65.7*  PO2ART  --  112.0*  HCO3  --  20.2  TCO2 18* 22  O2SAT  --  97.0    CBC Recent Labs  Lab 12/30/2019 0509 09-Jan-2020 0527 12/15/2019 0531  HGB 13.7 14.6 14.3  HCT 44.4 43.0 42.0  WBC 18.8*  --   --   PLT 152  --   --     COAGULATION No results for input(s): INR in the last 168 hours.  CARDIAC  No results for input(s): TROPONINI in the last 168 hours. No results for input(s): PROBNP in the last 168  hours.   CHEMISTRY Recent Labs  Lab Jan 09, 2020 0527 12/19/2019 0531  NA 140 140  K 5.2* 5.9*  CL 109  --   GLUCOSE 116*  --   BUN 26*  --   CREATININE 1.50*  --    Estimated Creatinine Clearance: 54.1 mL/min (A) (by C-G formula based on SCr of 1.5 mg/dL (H)).   LIVER No results for input(s): AST, ALT, ALKPHOS, BILITOT, PROT, ALBUMIN, INR in the last 168 hours.   INFECTIOUS No results for input(s): LATICACIDVEN, PROCALCITON in the last 168 hours.   ENDOCRINE CBG (last 3)  No results for input(s): GLUCAP in the last 72 hours.       IMAGING x48h  - image(s) personally visualized  -   highlighted in bold DG Chest Portable 1 View  Result Date: 09-Jan-2020 CLINICAL DATA:  Patient found unresponsive. Post CPR. Intubation. EXAM: PORTABLE CHEST 1 VIEW COMPARISON:  November 18, 2019 FINDINGS: A transcutaneous pacer lead overlies the left side of the mediastinum and medial left lung. An ETT is in good position. An NG tube terminates below today's film. No pneumothorax identified. Left retrocardiac opacity with obscuration left hemidiaphragm is identified. The lungs are otherwise clear. No nodules or masses. The heart, hila, and mediastinum are unremarkable. No obvious rib fractures identified. IMPRESSION: 1. Support apparatus as above. 2. Left retrocardiac opacity could represent atelectasis, infiltrate, or aspiration. Recommend follow-up to resolution. Electronically Signed   By: Gerome Sam III M.D   On: 01/01/2020 05:44

## 2020-01-04 NOTE — Progress Notes (Signed)
LTM EEG hooked up and running - no initial skin breakdown - push button tested - neuro notified.  

## 2020-01-04 NOTE — Progress Notes (Signed)
Fathers number in the ssytem called number, stepmother answered, father at church. Told stepmother ipatient in hospital will give more information when father call back. Gave number to 2 H front desk.

## 2020-01-04 NOTE — ED Notes (Signed)
Ice packs on patient in bilateral axilla and groin.

## 2020-01-05 ENCOUNTER — Inpatient Hospital Stay (HOSPITAL_COMMUNITY): Payer: Medicaid Other

## 2020-01-05 LAB — BASIC METABOLIC PANEL
Anion gap: 10 (ref 5–15)
Anion gap: 10 (ref 5–15)
Anion gap: 9 (ref 5–15)
Anion gap: 13 (ref 5–15)
Anion gap: 13 (ref 5–15)
Anion gap: 10 (ref 5–15)
BUN: 50 mg/dL — ABNORMAL HIGH (ref 8–23)
BUN: 52 mg/dL — ABNORMAL HIGH (ref 8–23)
BUN: 53 mg/dL — ABNORMAL HIGH (ref 8–23)
BUN: 53 mg/dL — ABNORMAL HIGH (ref 8–23)
BUN: 57 mg/dL — ABNORMAL HIGH (ref 8–23)
BUN: 57 mg/dL — ABNORMAL HIGH (ref 8–23)
CO2: 19 mmol/L — ABNORMAL LOW (ref 22–32)
CO2: 20 mmol/L — ABNORMAL LOW (ref 22–32)
CO2: 21 mmol/L — ABNORMAL LOW (ref 22–32)
CO2: 19 mmol/L — ABNORMAL LOW (ref 22–32)
CO2: 19 mmol/L — ABNORMAL LOW (ref 22–32)
CO2: 21 mmol/L — ABNORMAL LOW (ref 22–32)
Calcium: 7.5 mg/dL — ABNORMAL LOW (ref 8.9–10.3)
Calcium: 7.4 mg/dL — ABNORMAL LOW (ref 8.9–10.3)
Calcium: 7.2 mg/dL — ABNORMAL LOW (ref 8.9–10.3)
Calcium: 7.4 mg/dL — ABNORMAL LOW (ref 8.9–10.3)
Calcium: 7.3 mg/dL — ABNORMAL LOW (ref 8.9–10.3)
Calcium: 7.4 mg/dL — ABNORMAL LOW (ref 8.9–10.3)
Chloride: 116 mmol/L — ABNORMAL HIGH (ref 98–111)
Chloride: 116 mmol/L — ABNORMAL HIGH (ref 98–111)
Chloride: 117 mmol/L — ABNORMAL HIGH (ref 98–111)
Chloride: 115 mmol/L — ABNORMAL HIGH (ref 98–111)
Chloride: 116 mmol/L — ABNORMAL HIGH (ref 98–111)
Chloride: 118 mmol/L — ABNORMAL HIGH (ref 98–111)
Creatinine, Ser: 2.01 mg/dL — ABNORMAL HIGH (ref 0.61–1.24)
Creatinine, Ser: 2.16 mg/dL — ABNORMAL HIGH (ref 0.61–1.24)
Creatinine, Ser: 2.1 mg/dL — ABNORMAL HIGH (ref 0.61–1.24)
Creatinine, Ser: 2.21 mg/dL — ABNORMAL HIGH (ref 0.61–1.24)
Creatinine, Ser: 2.34 mg/dL — ABNORMAL HIGH (ref 0.61–1.24)
Creatinine, Ser: 2.49 mg/dL — ABNORMAL HIGH (ref 0.61–1.24)
GFR calc Af Amer: 39 mL/min — ABNORMAL LOW (ref >60)
GFR calc Af Amer: 36 mL/min — ABNORMAL LOW (ref >60)
GFR calc Af Amer: 37 mL/min — ABNORMAL LOW (ref >60)
GFR calc Af Amer: 35 mL/min — ABNORMAL LOW (ref >60)
GFR calc Af Amer: 33 mL/min — ABNORMAL LOW (ref >60)
GFR calc Af Amer: 30 mL/min — ABNORMAL LOW (ref >60)
GFR calc non Af Amer: 34 mL/min — ABNORMAL LOW (ref >60)
GFR calc non Af Amer: 31 mL/min — ABNORMAL LOW (ref >60)
GFR calc non Af Amer: 32 mL/min — ABNORMAL LOW (ref >60)
GFR calc non Af Amer: 30 mL/min — ABNORMAL LOW (ref >60)
GFR calc non Af Amer: 28 mL/min — ABNORMAL LOW (ref >60)
GFR calc non Af Amer: 26 mL/min — ABNORMAL LOW (ref >60)
Glucose, Bld: 126 mg/dL — ABNORMAL HIGH (ref 70–99)
Glucose, Bld: 128 mg/dL — ABNORMAL HIGH (ref 70–99)
Glucose, Bld: 129 mg/dL — ABNORMAL HIGH (ref 70–99)
Glucose, Bld: 125 mg/dL — ABNORMAL HIGH (ref 70–99)
Glucose, Bld: 141 mg/dL — ABNORMAL HIGH (ref 70–99)
Glucose, Bld: 151 mg/dL — ABNORMAL HIGH (ref 70–99)
Potassium: 4.3 mmol/L (ref 3.5–5.1)
Potassium: 4.3 mmol/L (ref 3.5–5.1)
Potassium: 4.3 mmol/L (ref 3.5–5.1)
Potassium: 4.4 mmol/L (ref 3.5–5.1)
Potassium: 4.4 mmol/L (ref 3.5–5.1)
Potassium: 4.6 mmol/L (ref 3.5–5.1)
Sodium: 145 mmol/L (ref 135–145)
Sodium: 146 mmol/L — ABNORMAL HIGH (ref 135–145)
Sodium: 147 mmol/L — ABNORMAL HIGH (ref 135–145)
Sodium: 147 mmol/L — ABNORMAL HIGH (ref 135–145)
Sodium: 148 mmol/L — ABNORMAL HIGH (ref 135–145)
Sodium: 149 mmol/L — ABNORMAL HIGH (ref 135–145)

## 2020-01-05 LAB — COMPREHENSIVE METABOLIC PANEL
ALT: 210 U/L — ABNORMAL HIGH (ref 0–44)
ALT: 204 U/L — ABNORMAL HIGH (ref 0–44)
AST: 284 U/L — ABNORMAL HIGH (ref 15–41)
AST: 241 U/L — ABNORMAL HIGH (ref 15–41)
Albumin: 2.8 g/dL — ABNORMAL LOW (ref 3.5–5.0)
Albumin: 2.7 g/dL — ABNORMAL LOW (ref 3.5–5.0)
Alkaline Phosphatase: 90 U/L (ref 38–126)
Alkaline Phosphatase: 80 U/L (ref 38–126)
Anion gap: 12 (ref 5–15)
Anion gap: 8 (ref 5–15)
BUN: 44 mg/dL — ABNORMAL HIGH (ref 8–23)
BUN: 48 mg/dL — ABNORMAL HIGH (ref 8–23)
CO2: 19 mmol/L — ABNORMAL LOW (ref 22–32)
CO2: 20 mmol/L — ABNORMAL LOW (ref 22–32)
Calcium: 7.4 mg/dL — ABNORMAL LOW (ref 8.9–10.3)
Calcium: 7.5 mg/dL — ABNORMAL LOW (ref 8.9–10.3)
Chloride: 113 mmol/L — ABNORMAL HIGH (ref 98–111)
Chloride: 118 mmol/L — ABNORMAL HIGH (ref 98–111)
Creatinine, Ser: 1.86 mg/dL — ABNORMAL HIGH (ref 0.61–1.24)
Creatinine, Ser: 1.93 mg/dL — ABNORMAL HIGH (ref 0.61–1.24)
GFR calc Af Amer: 43 mL/min — ABNORMAL LOW (ref >60)
GFR calc Af Amer: 41 mL/min — ABNORMAL LOW (ref >60)
GFR calc non Af Amer: 37 mL/min — ABNORMAL LOW (ref >60)
GFR calc non Af Amer: 36 mL/min — ABNORMAL LOW (ref >60)
Glucose, Bld: 132 mg/dL — ABNORMAL HIGH (ref 70–99)
Glucose, Bld: 128 mg/dL — ABNORMAL HIGH (ref 70–99)
Potassium: 4.3 mmol/L (ref 3.5–5.1)
Potassium: 4.2 mmol/L (ref 3.5–5.1)
Sodium: 144 mmol/L (ref 135–145)
Sodium: 146 mmol/L — ABNORMAL HIGH (ref 135–145)
Total Bilirubin: 0.6 mg/dL (ref 0.3–1.2)
Total Bilirubin: 0.6 mg/dL (ref 0.3–1.2)
Total Protein: 5.6 g/dL — ABNORMAL LOW (ref 6.5–8.1)
Total Protein: 5.7 g/dL — ABNORMAL LOW (ref 6.5–8.1)

## 2020-01-05 LAB — CBC WITH DIFFERENTIAL/PLATELET
Abs Immature Granulocytes: 0.02 10*3/uL (ref 0.00–0.07)
Basophils Absolute: 0 10*3/uL (ref 0.0–0.1)
Basophils Relative: 0 %
Eosinophils Absolute: 0 10*3/uL (ref 0.0–0.5)
Eosinophils Relative: 0 %
HCT: 50.1 % (ref 39.0–52.0)
Hemoglobin: 16.3 g/dL (ref 13.0–17.0)
Immature Granulocytes: 0 %
Lymphocytes Relative: 6 %
Lymphs Abs: 0.5 10*3/uL — ABNORMAL LOW (ref 0.7–4.0)
MCH: 29.9 pg (ref 26.0–34.0)
MCHC: 32.5 g/dL (ref 30.0–36.0)
MCV: 91.9 fL (ref 80.0–100.0)
Monocytes Absolute: 0.4 10*3/uL (ref 0.1–1.0)
Monocytes Relative: 5 %
Neutro Abs: 7.1 10*3/uL (ref 1.7–7.7)
Neutrophils Relative %: 89 %
Platelets: 108 10*3/uL — ABNORMAL LOW (ref 150–400)
RBC: 5.45 MIL/uL (ref 4.22–5.81)
RDW: 14.1 % (ref 11.5–15.5)
WBC: 8.1 10*3/uL (ref 4.0–10.5)
nRBC: 0 % (ref 0.0–0.2)

## 2020-01-05 LAB — GLUCOSE, CAPILLARY
Glucose-Capillary: 15 mg/dL — CL (ref 70–99)
Glucose-Capillary: 104 mg/dL — ABNORMAL HIGH (ref 70–99)
Glucose-Capillary: 106 mg/dL — ABNORMAL HIGH (ref 70–99)
Glucose-Capillary: 105 mg/dL — ABNORMAL HIGH (ref 70–99)
Glucose-Capillary: 112 mg/dL — ABNORMAL HIGH (ref 70–99)
Glucose-Capillary: 138 mg/dL — ABNORMAL HIGH (ref 70–99)

## 2020-01-05 LAB — POCT I-STAT 7, (LYTES, BLD GAS, ICA,H+H)
Acid-base deficit: 8 mmol/L — ABNORMAL HIGH (ref 0.0–2.0)
Bicarbonate: 19.4 mmol/L — ABNORMAL LOW (ref 20.0–28.0)
Calcium, Ion: 1.09 mmol/L — ABNORMAL LOW (ref 1.15–1.40)
HCT: 48 % (ref 39.0–52.0)
Hemoglobin: 16.3 g/dL (ref 13.0–17.0)
O2 Saturation: 91 %
Patient temperature: 33.1
Potassium: 4 mmol/L (ref 3.5–5.1)
Sodium: 145 mmol/L (ref 135–145)
TCO2: 21 mmol/L — ABNORMAL LOW (ref 22–32)
pCO2 arterial: 36.7 mmHg (ref 32.0–48.0)
pH, Arterial: 7.311 — ABNORMAL LOW (ref 7.350–7.450)
pO2, Arterial: 53 mmHg — ABNORMAL LOW (ref 83.0–108.0)

## 2020-01-05 LAB — OSMOLALITY, URINE: Osmolality, Ur: 380 mOsm/kg (ref 300–900)

## 2020-01-05 LAB — MAGNESIUM
Magnesium: 2.1 mg/dL (ref 1.7–2.4)
Magnesium: 2.1 mg/dL (ref 1.7–2.4)

## 2020-01-05 LAB — PHOSPHORUS
Phosphorus: 2.9 mg/dL (ref 2.5–4.6)
Phosphorus: 4.4 mg/dL (ref 2.5–4.6)

## 2020-01-05 LAB — SODIUM, URINE, RANDOM: Sodium, Ur: 17 mmol/L

## 2020-01-05 LAB — PROTIME-INR
INR: 1.4 — ABNORMAL HIGH (ref 0.8–1.2)
Prothrombin Time: 17.2 seconds — ABNORMAL HIGH (ref 11.4–15.2)

## 2020-01-05 LAB — LACTIC ACID, PLASMA: Lactic Acid, Venous: 1.7 mmol/L (ref 0.5–1.9)

## 2020-01-05 LAB — LEGIONELLA PNEUMOPHILA SEROGP 1 UR AG: L. pneumophila Serogp 1 Ur Ag: NEGATIVE

## 2020-01-05 LAB — PROCALCITONIN: Procalcitonin: 37.7 ng/mL

## 2020-01-05 MED ORDER — VITAL AF 1.2 CAL PO LIQD
1000.0000 mL | ORAL | Status: DC
  Administered 2020-01-05 – 2020-01-07 (×2): 1000 mL

## 2020-01-05 MED ORDER — LACTATED RINGERS IV BOLUS
1000.0000 mL | Freq: Once | INTRAVENOUS | Status: AC
  Administered 2020-01-05: 1000 mL via INTRAVENOUS

## 2020-01-05 MED ORDER — LACTATED RINGERS IV BOLUS
500.0000 mL | Freq: Once | INTRAVENOUS | Status: AC
  Administered 2020-01-05: 500 mL via INTRAVENOUS

## 2020-01-05 MED ORDER — GERHARDT'S BUTT CREAM
TOPICAL_CREAM | Freq: Every day | CUTANEOUS | Status: DC | PRN
  Filled 2020-01-05: qty 1

## 2020-01-05 MED ORDER — DIPHENOXYLATE-ATROPINE 2.5-0.025 MG/5ML PO LIQD
5.0000 mL | Freq: Four times a day (QID) | ORAL | Status: DC | PRN
  Administered 2020-01-05 – 2020-01-06 (×3): 5 mL
  Filled 2020-01-05 (×3): qty 5

## 2020-01-05 NOTE — Progress Notes (Signed)
Initial Nutrition Assessment  DOCUMENTATION CODES:   Not applicable  INTERVENTION:   Tube feeding:  -Vital AF 1.2 @ 55 ml/hr via OGT (1320 ml) -30 ml Prostat BID  Provides: 1784 kcals, 129 grams protein, 938 ml free water.   NUTRITION DIAGNOSIS:   Increased nutrient needs related to acute illness as evidenced by estimated needs.  GOAL:   Patient will meet greater than or equal to 90% of their needs  MONITOR:   Weight trends, Labs, I & O's, Vent status, Skin, TF tolerance  REASON FOR ASSESSMENT:   Consult, Ventilator Enteral/tube feeding initiation and management  ASSESSMENT:   Patient with unknown PMH. Presents this admission s/p cardiac arrest likely related to heroin/cocaine OD.   Pt discussed during ICU rounds and with RN.   On TTM33. Started rewarming this am. On paralytics. TF protocol started, pt tolerating well. Adjust tube feeding to better meet needs.   Weight records limited over the last year. Use 75 kg as EDW for now.   Patient is currently intubated on ventilator support MV: 9.8 L/min Temp (24hrs), Avg:91.6 F (33.1 C), Min:90.7 F (32.6 C), Max:93.2 F (34 C)   I/O: +2,7889 ml since admit  UOP: 1,190 ml x 24 hrs  Stool: 100 ml x 24 hrs   Drips: nimbex Medications: SS novolog Labs: Cr 2.01 trending up LFTs elevated CBG 104-205  NUTRITION - FOCUSED PHYSICAL EXAM:    Most Recent Value  Orbital Region  No depletion  Upper Arm Region  No depletion  Thoracic and Lumbar Region  Unable to assess  Buccal Region  Unable to assess  Temple Region  No depletion  Clavicle Bone Region  No depletion  Clavicle and Acromion Bone Region  No depletion  Scapular Bone Region  Unable to assess  Dorsal Hand  Unable to assess  Patellar Region  No depletion  Anterior Thigh Region  No depletion  Posterior Calf Region  No depletion  Edema (RD Assessment)  Mild  Hair  Reviewed  Eyes  Unable to assess  Mouth  Unable to assess  Skin  Reviewed  Nails  Unable  to assess     Diet Order:   Diet Order    None      EDUCATION NEEDS:   Not appropriate for education at this time  Skin:  Skin Assessment: Skin Integrity Issues: Skin Integrity Issues:: Stage II Stage II: sacrum  Last BM:  3/22  Height:   Ht Readings from Last 1 Encounters:  01/01/2020 5\' 9"  (1.753 m)    Weight:   Wt Readings from Last 1 Encounters:  01/05/20 75 kg    BMI:  Body mass index is 24.42 kg/m.  Estimated Nutritional Needs:   Kcal:  1673 kcal  Protein:  115-130 grams  Fluid:  >/= 1.6 L/day   01/07/20 RD, LDN Clinical Nutrition Pager listed in AMION

## 2020-01-05 NOTE — Progress Notes (Signed)
eLink Physician-Brief Progress Note Patient Name: Devin Gray DOB: 1956/08/18 MRN: [id number]   Date of Service  01/05/2020  HPI/Events of Note  Oliguria. Patient is s/p arrest on TTM. UOP dropping over course of shift. Was 100cc/hr, then 50, then 25cc/hr. Bladder scan with <20cc. Significant diarrhea losses contributing to hypokalemia.   eICU Interventions  May be post-arrest ATN vs hypovolemia in setting of ongoing diarrheal losses.  500cc LR bolus. Assess UOP response to this bolus to determine further action.     Intervention Category Intermediate Interventions: Oliguria - evaluation and management  Marveen Reeks Eliah Marquard 01/05/2020, 1:25 AM

## 2020-01-05 NOTE — Progress Notes (Signed)
LTM maintenance completed; pressed PZ, checked skin under Fp1, Fp2, A1, F8, and Pz- no skin breakdown was seen.

## 2020-01-05 NOTE — Plan of Care (Signed)

## 2020-01-05 NOTE — Progress Notes (Signed)
Rewarming initiated at 0904 verified by Cybill RN.

## 2020-01-05 NOTE — Progress Notes (Signed)
PULMONARY / CRITICAL CARE MEDICINE   NAME:  Devin Gray, MRN:  [id number], DOB:  1955-10-17, LOS: 1 ADMISSION DATE:  12/19/2019, CONSULTATION DATE:  01/01/2020 REFERRING MD:  Ezequiel Essex, MD CHIEF COMPLAINT:  Cardiac arrest  BRIEF HISTORY:    64 year old male with unknown PMHx presenting s/p cardiac arrest suspected to be secondary to heroin overdose. Possible down time of up to 2 hours after which he received 15 minutes of CPR and 3 rounds of epinephrine, no shock as patient was asystole on EMS arival. Patient brought in on epinephrine infusion that was discontinued in the ED. Patient unresponsive w/fixed and dilated pupils but with spontaneous respirations.  SIGNIFICANT PAST MEDICAL HISTORY   No known past medical history  SIGNIFICANT EVENTS:  3/21> admitted s/p cardiac arrest started on TTM  STUDIES:   3/21 CT Head >> no acute intracranial abnormality; chronic left frontal lobe encephalomalacia 3/22 EEG >> diffuse encephalopathy, nonspecific to etiology, no seizures or definite epileptiform discharges  3/22 MRI Brain >>  CULTURES:  3/21 SARS CoV2 and influenza A/B - negative  ANTIBIOTICS:  Zosyn 3/21 Unasyn 3/21  LINES/TUBES:  ETT 3/21>> Left subclavian CVC 3/21>>   CONSULTANTS:  None SUBJECTIVE:  Patient is heavily sedated, paralyzed and mechanically ventilated.   CONSTITUTIONAL: BP 101/76   Pulse (!) 57   Temp (!) 91.4 F (33 C)   Resp 18   Ht 5\' 9"  (1.753 m)   Wt 75 kg   SpO2 93%   BMI 24.42 kg/m   I/O last 3 completed shifts: In: 3478.7 [I.V.:922.5; Other:120; NG/GT:710.7; IV Piggyback:1725.6] Out: 1290 [Urine:1190; Stool:100]  CVP:  [0 mmHg-8 mmHg] 6 mmHg  Vent Mode: PRVC FiO2 (%):  [60 %-80 %] 60 % Set Rate:  [15 bmp-18 bmp] 18 bmp Vt Set:  [560 mL] 560 mL PEEP:  [5 cmH20-10 cmH20] 10 cmH20 Plateau Pressure:  [16 cmH20-24 cmH20] 20 cmH20  PHYSICAL EXAM: General:  Critically ill appearing male who appears to be stated age; heavily sedated and  mechanically ventilated Neuro:  Sedated, paralyzed and mechanically ventilated - nonreactive pupils, no gag reflex or cough or dolls eyes  HEENT:  Atraumatic, normocephalic, pupils dilated and unresponsive to light bilaterally Cardiovascular:  S1 and S2 present, RRR, no m/r/g Lungs:  Clear to auscultation bilaterally on vent Abdomen:  Nondistended, soft, BSx4  RESOLVED PROBLEM LIST   ASSESSMENT AND PLAN   Acute hypoxic respiratory failure 2/2 cardiac arrest Cardiac arrest likely 2/2 polysubstance use: Presented after at least 2 hours of down time s/p CPR for at least 15 minutes initially found to be in asystole s/p epi x3 and epinephrine gtt. He was initiated on TTM protocol - TTM protocol, currently rewarming - Continue full vent support - Goal SpO2 >27%  Acute metabolic encephalopathy in setting of cardiac arrest and polysubstance use: Patient heavily sedated and paralyzed per TTM protocol; however, pupils unreactive and without dolls eyes. No cough or gag reflex appreciated. EEG with diffuse encephalopathy, no seizure or epileptiform discharges noted. CT head wnl.  - wean sedation once TTM protocol complete  - may need MRI brain if continues to be encephalopathic   Acute renal failure likely ATN 2/2 cardiac arrest: Hyperkalemia:  Patient noted to be hyperkalemic on arrival s/p lokelma and bicarb. K 4.3 this AM. However, patient with decreased urine output despite fluid resuscitation challenge and also noted to have worsening renal function. Likely ATN in setting of cardiac arrest with prolonged down time.  - serum osm - urine  studies - avoid nephrotoxic agents - monitor and replete electrolytes prn  Best Practice / Goals of Care / Disposition.   Diet: feeding supplement per dietician Pain/Anxiety/Delirium protocol (if indicated): RASS goal -2 to -3 VAP protocol (if indicated): Per protocol DVT prophylaxis: SCD's, Heparin 5000U q8h GI prophylaxis: PPI Glucose control:  SSI Mobility: Bed bound Code Status: FULL Family Communication: will update family 3/22 Disposition: ICU  LABS  Glucose Recent Labs  Lab 01/08/2020 1624 12/27/2019 1718 01/14/2020 1953 12/26/2019 2341 01/05/20 0408 01/05/20 0735  GLUCAP 150* 224* 164* 122* 104* 106*    BMET Recent Labs  Lab 01/07/2020 1512 12/29/2019 1715 01/03/2020 2102 01/05/20 0346 01/05/20 0502  NA 143   < > 143 144 145  K 3.6   < > 3.3* 4.3 4.0  CL 109  --  112* 113*  --   CO2 19*  --  21* 19*  --   BUN 37*  --  41* 44*  --   CREATININE 1.72*  --  1.81* 1.86*  --   GLUCOSE 226*  --  180* 132*  --    < > = values in this interval not displayed.    Liver Enzymes Recent Labs  Lab 01/13/2020 0509 01/07/2020 1152 01/05/20 0346  AST 280* 508* 284*  ALT 168* 237* 210*  ALKPHOS 117 159* 90  BILITOT 0.7 2.3* 0.6  ALBUMIN 3.6 3.6 2.8*    Electrolytes Recent Labs  Lab 12/22/2019 1152 12/30/2019 1512 12/26/2019 2102 12/21/2019 2344 01/05/20 0346  CALCIUM  --  7.4* 7.5*  --  7.4*  MG   < >  --  2.2 2.3 2.1  PHOS  --   --  3.0 4.9* 2.9   < > = values in this interval not displayed.    CBC Recent Labs  Lab 12/15/2019 0509 01/01/2020 0527 12/20/2019 0723 12/27/2019 0921 01/13/2020 2039 01/05/20 0346 01/05/20 0502  WBC 18.8*  --  8.0  --   --  8.1  --   HGB 13.7   < > 16.1   < > 14.6 16.3 16.3  HCT 44.4   < > 49.7   < > 43.0 50.1 48.0  PLT 152  --  141*  --   --  108*  --    < > = values in this interval not displayed.    ABG Recent Labs  Lab 12/27/2019 1715 12/26/2019 2039 01/05/20 0502  PHART 7.232* 7.291* 7.311*  PCO2ART 46.5 34.1 36.7  PO2ART 68.0* 107.0 53.0*    Coag's Recent Labs  Lab 01/03/2020 0723 01/03/2020 1512 01/05/20 0346  APTT 39* 33  --   INR 1.3* 1.6* 1.4*    Sepsis Markers Recent Labs  Lab 12/21/2019 0723 01/08/2020 2343 01/05/20 0346  LATICACIDVEN  --  1.7  --   PROCALCITON 0.46  --  37.70    Cardiac Enzymes No results for input(s): TROPONINI, PROBNP in the last 168  hours.   Critical care time: 45 minutes  Eliezer Bottom, MD Internal Medicine, PGY-1 01/05/20 9:45 AM

## 2020-01-05 NOTE — Progress Notes (Signed)
Visitation policy explained to family over the phone. The two designated visitors will be Rudie Meyer (sister) and Harley Alto (daughter).

## 2020-01-05 NOTE — Procedures (Addendum)
Patient Name: CORA STETSON  MRN: [id number]  Epilepsy Attending: Charlsie Quest  Referring Physician/Provider: Dr Kalman Shan Duration: 12/23/2019 1148 to 01/05/2020 1148  Patient history: 63yo M s.p cardiac arrest on TTM. EEG to evaluate for seizure.   Level of alertness: comatose  AEDs during EEG study: versed  Technical aspects: This EEG study was done with scalp electrodes positioned according to the 10-20 International system of electrode placement. Electrical activity was acquired at a sampling rate of 500Hz  and reviewed with a high frequency filter of 70Hz  and a low frequency filter of 1Hz . EEG data were recorded continuously and digitally stored.   DESCRIPTION: EEG showed continuous generalized background suppression lasting 15 to 20 seconds with intermittent bursts of generalized high amplitude polymorphic 3 to 6 Hz theta-delta slowing lasting 1 to 5 seconds.  Reactivity was not tested during the study hyperventilation and photic stimulation were not performed.  Abnormality -Burst suppression, generalized  IMPRESSION: This study is suggestive of profound diffuse encephalopathy, nonspecific to etiology. No seizures or definite epileptiform discharges were seen throughout the recording.     Virgil Lightner 

## 2020-01-06 LAB — BASIC METABOLIC PANEL
Anion gap: 11 (ref 5–15)
Anion gap: 12 (ref 5–15)
Anion gap: 12 (ref 5–15)
Anion gap: 10 (ref 5–15)
BUN: 59 mg/dL — ABNORMAL HIGH (ref 8–23)
BUN: 61 mg/dL — ABNORMAL HIGH (ref 8–23)
BUN: 63 mg/dL — ABNORMAL HIGH (ref 8–23)
BUN: 63 mg/dL — ABNORMAL HIGH (ref 8–23)
CO2: 20 mmol/L — ABNORMAL LOW (ref 22–32)
CO2: 21 mmol/L — ABNORMAL LOW (ref 22–32)
CO2: 20 mmol/L — ABNORMAL LOW (ref 22–32)
CO2: 20 mmol/L — ABNORMAL LOW (ref 22–32)
Calcium: 7.3 mg/dL — ABNORMAL LOW (ref 8.9–10.3)
Calcium: 7.4 mg/dL — ABNORMAL LOW (ref 8.9–10.3)
Calcium: 7.3 mg/dL — ABNORMAL LOW (ref 8.9–10.3)
Calcium: 7.3 mg/dL — ABNORMAL LOW (ref 8.9–10.3)
Chloride: 119 mmol/L — ABNORMAL HIGH (ref 98–111)
Chloride: 120 mmol/L — ABNORMAL HIGH (ref 98–111)
Chloride: 123 mmol/L — ABNORMAL HIGH (ref 98–111)
Chloride: 125 mmol/L — ABNORMAL HIGH (ref 98–111)
Creatinine, Ser: 2.73 mg/dL — ABNORMAL HIGH (ref 0.61–1.24)
Creatinine, Ser: 2.68 mg/dL — ABNORMAL HIGH (ref 0.61–1.24)
Creatinine, Ser: 2.84 mg/dL — ABNORMAL HIGH (ref 0.61–1.24)
Creatinine, Ser: 2.79 mg/dL — ABNORMAL HIGH (ref 0.61–1.24)
GFR calc Af Amer: 27 mL/min — ABNORMAL LOW (ref >60)
GFR calc Af Amer: 28 mL/min — ABNORMAL LOW (ref >60)
GFR calc Af Amer: 26 mL/min — ABNORMAL LOW (ref >60)
GFR calc Af Amer: 27 mL/min — ABNORMAL LOW (ref >60)
GFR calc non Af Amer: 24 mL/min — ABNORMAL LOW (ref >60)
GFR calc non Af Amer: 24 mL/min — ABNORMAL LOW (ref >60)
GFR calc non Af Amer: 22 mL/min — ABNORMAL LOW (ref >60)
GFR calc non Af Amer: 23 mL/min — ABNORMAL LOW (ref >60)
Glucose, Bld: 151 mg/dL — ABNORMAL HIGH (ref 70–99)
Glucose, Bld: 142 mg/dL — ABNORMAL HIGH (ref 70–99)
Glucose, Bld: 143 mg/dL — ABNORMAL HIGH (ref 70–99)
Glucose, Bld: 158 mg/dL — ABNORMAL HIGH (ref 70–99)
Potassium: 4.4 mmol/L (ref 3.5–5.1)
Potassium: 4.3 mmol/L (ref 3.5–5.1)
Potassium: 3.6 mmol/L (ref 3.5–5.1)
Potassium: 3.3 mmol/L — ABNORMAL LOW (ref 3.5–5.1)
Sodium: 150 mmol/L — ABNORMAL HIGH (ref 135–145)
Sodium: 153 mmol/L — ABNORMAL HIGH (ref 135–145)
Sodium: 155 mmol/L — ABNORMAL HIGH (ref 135–145)
Sodium: 155 mmol/L — ABNORMAL HIGH (ref 135–145)

## 2020-01-06 LAB — COMPREHENSIVE METABOLIC PANEL
ALT: 140 U/L — ABNORMAL HIGH (ref 0–44)
AST: 109 U/L — ABNORMAL HIGH (ref 15–41)
Albumin: 2.4 g/dL — ABNORMAL LOW (ref 3.5–5.0)
Alkaline Phosphatase: 66 U/L (ref 38–126)
Anion gap: 9 (ref 5–15)
BUN: 62 mg/dL — ABNORMAL HIGH (ref 8–23)
CO2: 21 mmol/L — ABNORMAL LOW (ref 22–32)
Calcium: 7.3 mg/dL — ABNORMAL LOW (ref 8.9–10.3)
Chloride: 121 mmol/L — ABNORMAL HIGH (ref 98–111)
Creatinine, Ser: 2.92 mg/dL — ABNORMAL HIGH (ref 0.61–1.24)
GFR calc Af Amer: 25 mL/min — ABNORMAL LOW (ref >60)
GFR calc non Af Amer: 22 mL/min — ABNORMAL LOW (ref >60)
Glucose, Bld: 148 mg/dL — ABNORMAL HIGH (ref 70–99)
Potassium: 4.2 mmol/L (ref 3.5–5.1)
Sodium: 151 mmol/L — ABNORMAL HIGH (ref 135–145)
Total Bilirubin: 0.7 mg/dL (ref 0.3–1.2)
Total Protein: 5.3 g/dL — ABNORMAL LOW (ref 6.5–8.1)

## 2020-01-06 LAB — CBC WITH DIFFERENTIAL/PLATELET
Abs Immature Granulocytes: 0.64 10*3/uL — ABNORMAL HIGH (ref 0.00–0.07)
Basophils Absolute: 0.1 10*3/uL (ref 0.0–0.1)
Basophils Relative: 0 %
Eosinophils Absolute: 0 10*3/uL (ref 0.0–0.5)
Eosinophils Relative: 0 %
HCT: 46.1 % (ref 39.0–52.0)
Hemoglobin: 14.8 g/dL (ref 13.0–17.0)
Immature Granulocytes: 4 %
Lymphocytes Relative: 3 %
Lymphs Abs: 0.5 10*3/uL — ABNORMAL LOW (ref 0.7–4.0)
MCH: 30.1 pg (ref 26.0–34.0)
MCHC: 32.1 g/dL (ref 30.0–36.0)
MCV: 93.7 fL (ref 80.0–100.0)
Monocytes Absolute: 1 10*3/uL (ref 0.1–1.0)
Monocytes Relative: 7 %
Neutro Abs: 12.5 10*3/uL — ABNORMAL HIGH (ref 1.7–7.7)
Neutrophils Relative %: 86 %
Platelets: 87 10*3/uL — ABNORMAL LOW (ref 150–400)
RBC: 4.92 MIL/uL (ref 4.22–5.81)
RDW: 14.9 % (ref 11.5–15.5)
WBC Morphology: INCREASED
WBC: 14.8 10*3/uL — ABNORMAL HIGH (ref 4.0–10.5)
nRBC: 0 % (ref 0.0–0.2)

## 2020-01-06 LAB — GLUCOSE, CAPILLARY
Glucose-Capillary: 120 mg/dL — ABNORMAL HIGH (ref 70–99)
Glucose-Capillary: 121 mg/dL — ABNORMAL HIGH (ref 70–99)
Glucose-Capillary: 122 mg/dL — ABNORMAL HIGH (ref 70–99)
Glucose-Capillary: 124 mg/dL — ABNORMAL HIGH (ref 70–99)
Glucose-Capillary: 129 mg/dL — ABNORMAL HIGH (ref 70–99)
Glucose-Capillary: 133 mg/dL — ABNORMAL HIGH (ref 70–99)
Glucose-Capillary: 138 mg/dL — ABNORMAL HIGH (ref 70–99)

## 2020-01-06 LAB — PROCALCITONIN: Procalcitonin: 34.77 ng/mL

## 2020-01-06 MED ORDER — DEXTROSE 5 % IV SOLN: INTRAVENOUS | Status: DC

## 2020-01-06 MED ORDER — LACTATED RINGERS IV BOLUS
1000.0000 mL | Freq: Once | INTRAVENOUS | Status: AC
  Administered 2020-01-06: 1000 mL via INTRAVENOUS

## 2020-01-06 MED ORDER — SODIUM CHLORIDE 0.9 % IV SOLN
3.0000 g | INTRAVENOUS | Status: DC
  Administered 2020-01-07: 3 g via INTRAVENOUS
  Filled 2020-01-06: qty 3

## 2020-01-06 MED ORDER — FREE WATER
300.0000 mL | Status: DC
  Administered 2020-01-06 – 2020-01-07 (×11): 300 mL

## 2020-01-06 MED ORDER — LACTATED RINGERS IV SOLN: INTRAVENOUS | Status: DC

## 2020-01-06 NOTE — Progress Notes (Addendum)
PULMONARY / CRITICAL CARE MEDICINE   NAME:  Devin Gray, MRN:  [id number], DOB:  11/25/1955, LOS: 2 ADMISSION DATE:  12/15/2019, CONSULTATION DATE:  01/12/2020 REFERRING MD:  Ezequiel Essex, MD CHIEF COMPLAINT:  Cardiac arrest  BRIEF HISTORY:    64 year old male with unknown PMHx presenting s/p cardiac arrest suspected to be secondary to heroin overdose. Possible down time of up to 2 hours after which he received 15 minutes of CPR and 3 rounds of epinephrine, no shock as patient was asystole on EMS arival. Patient brought in on epinephrine infusion that was discontinued in the ED. Patient unresponsive w/fixed and dilated pupils but with spontaneous respirations.  SIGNIFICANT PAST MEDICAL HISTORY   No known past medical history  SIGNIFICANT EVENTS:  3/21> admitted s/p cardiac arrest started on TTM  STUDIES:   3/21 CT Head >> no acute intracranial abnormality; chronic left frontal lobe encephalomalacia 3/22 EEG >> diffuse encephalopathy, nonspecific to etiology, no seizures or definite epileptiform discharges   CULTURES:  3/21 SARS CoV2 and influenza A/B - negative  ANTIBIOTICS:  Zosyn 3/20 Unasyn 3/21>>  LINES/TUBES:  ETT 3/21>> Left subclavian CVC 3/21>>   CONSULTANTS:  None SUBJECTIVE:  Patient is currently off sedation, continues to be unresponsive and on mechanical ventilation.    CONSTITUTIONAL: BP 108/70 (BP Location: Left Arm)   Pulse 96   Temp 99 F (37.2 C) (Bladder)   Resp 16   Ht 5\' 9"  (1.753 m)   Wt 76 kg   SpO2 98%   BMI 24.74 kg/m   I/O last 3 completed shifts: In: 4632.4 [I.V.:966.3; NG/GT:1224.4; IV Piggyback:2441.6] Out: 1319 [Urine:1167; Stool:152]  CVP:  [1 mmHg-16 mmHg] 8 mmHg  Vent Mode: PRVC FiO2 (%):  [50 %-60 %] 50 % Set Rate:  [18 bmp] 18 bmp Vt Set:  [560 mL] 560 mL PEEP:  [10 cmH20] 10 cmH20 Plateau Pressure:  [20 cmH20-26 cmH20] 26 cmH20  PHYSICAL EXAM: General:  Critically ill appearing male who appears to be stated age;  unresponsive to verbal and noxious stimuli and mechanically ventilated Neuro:  Unresponsive to verbal and noxious stimuli mechanically nonreactive pupils, no gag reflex or cough or dolls eyes  HEENT:  Atraumatic, normocephalic, pupils dilated and unresponsive to light bilaterally Cardiovascular:  S1 and S2 present, RRR, no m/r/g Lungs:  Clear to auscultation bilaterally on vent Abdomen:  Nondistended, soft, BSx4  RESOLVED PROBLEM LIST   ASSESSMENT AND PLAN   Acute hypoxic respiratory failure 2/2 cardiac arrest Cardiac arrest likely 2/2 polysubstance use: Presented after at least 2 hours of down time s/p CPR for at least 15 minutes initially found to be in asystole s/p epi x3 and epinephrine gtt. He was initiated on TTM protocol - TTM protocol complete; patient continued in maintenance phase - Continue full vent support - Goal SpO2 >92%  Aspiration pneumonia: Patient noted to have vomit when found. Concern for aspiration pneumonia given elevated PCT and leukocytosis. CXR with perihilar opacities and areas of interstitial prominence possibly sequale of aspiration.  - Unasyn day 3 - CBC daily - respiratory culture  Acute metabolic encephalopathy in setting of cardiac arrest and polysubstance use: Patient is off sedation  However, remains unresponsive to verbal and noxious stimuli, pupils unreactive and without dolls eyes. No cough or gag reflex appreciated. EEG with diffuse encephalopathy, no seizure or epileptiform discharges noted. CT head wnl.  - observe off sedation for any neurologic recovery - may need MRI brain if continues to be encephalopathic   Acute renal  failure likely ATN 2/2 cardiac arrest: Hyperkalemia:  Hypernatremia: Patient noted to be hyperkalemic on arrival s/p lokelma and bicarb. K 4.2 this AM. However, patient with decreased urine output despite fluid resuscitation challenge and also noted to have worsening renal function. Likely ATN in setting of cardiac arrest with  prolonged down time.  Free water deficit of 3.6L - Fluid resuscitation - F/u BMP in afternoon - avoid nephrotoxic agents - monitor and replete electrolytes prn  Best Practice / Goals of Care / Disposition.   Diet: feeding supplement per dietician Pain/Anxiety/Delirium protocol (if indicated): RASS goal 0 to -1 VAP protocol (if indicated): Per protocol DVT prophylaxis: SCD's, sq heparin GI prophylaxis: PPI Glucose control: SSI Mobility: Bed bound Code Status: FULL Family Communication: will update family 3/23 Disposition: ICU  LABS  Glucose Recent Labs  Lab 01/05/20 0735 01/05/20 1130 01/05/20 1637 01/05/20 2035 01/06/20 0050 01/06/20 0410  GLUCAP 106* 105* 112* 138* 124* 133*    BMET Recent Labs  Lab 01/06/20 0000 01/06/20 0207 01/06/20 0419  NA 150* 153* 151*  K 4.4 4.3 4.2  CL 119* 120* 121*  CO2 20* 21* 21*  BUN 59* 61* 62*  CREATININE 2.73* 2.68* 2.92*  GLUCOSE 151* 142* 148*    Liver Enzymes Recent Labs  Lab 01/05/20 0346 01/05/20 0929 01/06/20 0419  AST 284* 241* 109*  ALT 210* 204* 140*  ALKPHOS 90 80 66  BILITOT 0.6 0.6 0.7  ALBUMIN 2.8* 2.7* 2.4*    Electrolytes Recent Labs  Lab 01/13/2020 2102 01/13/2020 2344 01/05/20 0346 01/05/20 0929 01/05/20 1700 01/05/20 1803 01/06/20 0000 01/06/20 0207 01/06/20 0419  CALCIUM   < >  --  7.4*   < > 7.2*   < > 7.3* 7.4* 7.3*  MG  --  2.3 2.1  --  2.1  --   --   --   --   PHOS  --  4.9* 2.9  --  4.4  --   --   --   --    < > = values in this interval not displayed.    CBC Recent Labs  Lab 01/13/20 0723 01-13-2020 0921 01/05/20 0346 01/05/20 0502 01/06/20 0419  WBC 8.0  --  8.1  --  14.8*  HGB 16.1   < > 16.3 16.3 14.8  HCT 49.7   < > 50.1 48.0 46.1  PLT 141*  --  108*  --  87*   < > = values in this interval not displayed.    ABG Recent Labs  Lab 01/13/2020 1715 01/13/20 2039 01/05/20 0502  PHART 7.232* 7.291* 7.311*  PCO2ART 46.5 34.1 36.7  PO2ART 68.0* 107.0 53.0*     Coag's Recent Labs  Lab January 13, 2020 0723 01/13/20 1512 01/05/20 0346  APTT 39* 33  --   INR 1.3* 1.6* 1.4*    Sepsis Markers Recent Labs  Lab January 13, 2020 0723 2020-01-13 2343 01/05/20 0346 01/06/20 0419  LATICACIDVEN  --  1.7  --   --   PROCALCITON 0.46  --  37.70 34.77    Cardiac Enzymes No results for input(s): TROPONINI, PROBNP in the last 168 hours.   Critical care time: 35 minutes  Eliezer Bottom, MD Internal Medicine, PGY-1 01/06/20 7:25 AM

## 2020-01-06 NOTE — Procedures (Addendum)
Patient Name: Devin Gray  MRN: [id number]  Epilepsy Attending: Charlsie Quest  Referring Physician/Provider: Dr Kalman Shan Duration: 01/05/2020 1148 to 01/06/2020 1034 Total duration of study: 47 hours  Patient history: 63yo M s.p cardiac arrest on TTM. EEG to evaluate for seizure.   Level of alertness: comatose  AEDs during EEG study: versed  Technical aspects: This EEG study was done with scalp electrodes positioned according to the 10-20 International system of electrode placement. Electrical activity was acquired at a sampling rate of 500Hz  and reviewed with a high frequency filter of 70Hz  and a low frequency filter of 1Hz . EEG data were recorded continuously and digitally stored.   DESCRIPTION: EEG initially showed continuous generalized background suppression lasting 15 to 20 seconds with intermittent bursts of generalized high amplitude polymorphic 3 to 6 Hz theta-delta slowing lasting 1 to 5 seconds. After around 2100 on 01/05/2020, eeg gradually showed low amplitude continuous 2-3Hz  delta slowing. Reactivity was not tested during the study. Hyperventilation and photic stimulation were not performed.  Abnormality - Burst suppression, generalized - Continuous slow, generalized  IMPRESSION: This study is suggestive of profound diffuse encephalopathy, nonspecific to etiology. No seizures or definite epileptiform discharges were seen throughout the recording.  EEG appeared improving compared to yesterday's study.    Tharon Bomar 

## 2020-01-06 NOTE — Progress Notes (Signed)
Pharmacy Antibiotic Note  Devin Gray is a 64 y.o. male admitted on 01/18/20 s/p cardiac arrest, vomit in airway >> concern for aspiration pneumonia.  Pharmacy has been consulted for Unasyn dosing. Cr rising, minimal UOP.  Plan: Reduce Unasyn to 3g IV q24h for now  Height: 5\' 9"  (175.3 cm) Weight: 167 lb 8.8 oz (76 kg) IBW/kg (Calculated) : 70.7  Temp (24hrs), Avg:94.2 F (34.6 C), Min:91.4 F (33 C), Max:99 F (37.2 C)  Recent Labs  Lab Jan 18, 2020 0509 01-18-2020 0527 01/18/2020 0723 01/18/20 1152 01/18/20 2102 2020/01/18 2343 01/05/20 0346 01/05/20 0929 01/05/20 2023 01/05/20 2228 01/06/20 0000 01/06/20 0207 01/06/20 0419  WBC 18.8*  --  8.0  --   --   --  8.1  --   --   --   --   --  14.8*  CREATININE 1.85*   < > 1.58*   < >   < >  --  1.86*   < > 2.34* 2.49* 2.73* 2.68* 2.92*  LATICACIDVEN  --   --   --   --   --  1.7  --   --   --   --   --   --   --    < > = values in this interval not displayed.    Estimated Creatinine Clearance: 25.6 mL/min (A) (by C-G formula based on SCr of 2.92 mg/dL (H)).    No Known Allergies   Thank you for allowing pharmacy to be a part of this patient's care.  01/08/20, PharmD, BCPS Clinical Pharmacist [phone] Please check AMION for all [facility] numbers 01/06/2020

## 2020-01-06 NOTE — Progress Notes (Signed)
LTM EEG discontinued - no skin breakdown at unhook.   

## 2020-01-06 NOTE — Progress Notes (Signed)
RT unable to obtain sputum at this time, left trap in place. Will make RN aware.

## 2020-01-06 NOTE — Progress Notes (Signed)
Spoke with patient's sister, Archie Patten (was able to provide password). Gave her updates on patient and answered all questions related to patient care.

## 2020-01-07 ENCOUNTER — Inpatient Hospital Stay (HOSPITAL_COMMUNITY): Payer: Medicaid Other

## 2020-01-07 LAB — COMPREHENSIVE METABOLIC PANEL
ALT: 75 U/L — ABNORMAL HIGH (ref 0–44)
AST: 94 U/L — ABNORMAL HIGH (ref 15–41)
Albumin: 2 g/dL — ABNORMAL LOW (ref 3.5–5.0)
Alkaline Phosphatase: 59 U/L (ref 38–126)
Anion gap: 7 (ref 5–15)
BUN: 55 mg/dL — ABNORMAL HIGH (ref 8–23)
CO2: 22 mmol/L (ref 22–32)
Calcium: 7.5 mg/dL — ABNORMAL LOW (ref 8.9–10.3)
Chloride: 119 mmol/L — ABNORMAL HIGH (ref 98–111)
Creatinine, Ser: 2.61 mg/dL — ABNORMAL HIGH (ref 0.61–1.24)
GFR calc Af Amer: 29 mL/min — ABNORMAL LOW (ref >60)
GFR calc non Af Amer: 25 mL/min — ABNORMAL LOW (ref >60)
Glucose, Bld: 150 mg/dL — ABNORMAL HIGH (ref 70–99)
Potassium: 3.3 mmol/L — ABNORMAL LOW (ref 3.5–5.1)
Sodium: 148 mmol/L — ABNORMAL HIGH (ref 135–145)
Total Bilirubin: 0.6 mg/dL (ref 0.3–1.2)
Total Protein: 4.8 g/dL — ABNORMAL LOW (ref 6.5–8.1)

## 2020-01-07 LAB — CBC WITH DIFFERENTIAL/PLATELET
Abs Immature Granulocytes: 0.05 10*3/uL (ref 0.00–0.07)
Basophils Absolute: 0 10*3/uL (ref 0.0–0.1)
Basophils Relative: 1 %
Eosinophils Absolute: 0 10*3/uL (ref 0.0–0.5)
Eosinophils Relative: 0 %
HCT: 39.7 % (ref 39.0–52.0)
Hemoglobin: 12.6 g/dL — ABNORMAL LOW (ref 13.0–17.0)
Immature Granulocytes: 1 %
Lymphocytes Relative: 12 %
Lymphs Abs: 0.8 10*3/uL (ref 0.7–4.0)
MCH: 30.1 pg (ref 26.0–34.0)
MCHC: 31.7 g/dL (ref 30.0–36.0)
MCV: 94.7 fL (ref 80.0–100.0)
Monocytes Absolute: 0.7 10*3/uL (ref 0.1–1.0)
Monocytes Relative: 10 %
Neutro Abs: 5.2 10*3/uL (ref 1.7–7.7)
Neutrophils Relative %: 76 %
Platelets: 61 10*3/uL — ABNORMAL LOW (ref 150–400)
RBC: 4.19 MIL/uL — ABNORMAL LOW (ref 4.22–5.81)
RDW: 15.4 % (ref 11.5–15.5)
WBC: 6.8 10*3/uL (ref 4.0–10.5)
nRBC: 0.3 % — ABNORMAL HIGH (ref 0.0–0.2)

## 2020-01-07 LAB — POCT I-STAT 7, (LYTES, BLD GAS, ICA,H+H)
Acid-base deficit: 3 mmol/L — ABNORMAL HIGH (ref 0.0–2.0)
Bicarbonate: 23.4 mmol/L (ref 20.0–28.0)
Calcium, Ion: 1.28 mmol/L (ref 1.15–1.40)
HCT: 35 % — ABNORMAL LOW (ref 39.0–52.0)
Hemoglobin: 11.9 g/dL — ABNORMAL LOW (ref 13.0–17.0)
O2 Saturation: 98 %
Patient temperature: 98.8
Potassium: 3 mmol/L — ABNORMAL LOW (ref 3.5–5.1)
Sodium: 150 mmol/L — ABNORMAL HIGH (ref 135–145)
TCO2: 25 mmol/L (ref 22–32)
pCO2 arterial: 45.6 mmHg (ref 32.0–48.0)
pH, Arterial: 7.32 — ABNORMAL LOW (ref 7.350–7.450)
pO2, Arterial: 109 mmHg — ABNORMAL HIGH (ref 83.0–108.0)

## 2020-01-07 LAB — BASIC METABOLIC PANEL
Anion gap: 9 (ref 5–15)
BUN: 53 mg/dL — ABNORMAL HIGH (ref 8–23)
CO2: 22 mmol/L (ref 22–32)
Calcium: 7.8 mg/dL — ABNORMAL LOW (ref 8.9–10.3)
Chloride: 118 mmol/L — ABNORMAL HIGH (ref 98–111)
Creatinine, Ser: 2.31 mg/dL — ABNORMAL HIGH (ref 0.61–1.24)
GFR calc Af Amer: 33 mL/min — ABNORMAL LOW (ref >60)
GFR calc non Af Amer: 29 mL/min — ABNORMAL LOW (ref >60)
Glucose, Bld: 107 mg/dL — ABNORMAL HIGH (ref 70–99)
Potassium: 3.1 mmol/L — ABNORMAL LOW (ref 3.5–5.1)
Sodium: 149 mmol/L — ABNORMAL HIGH (ref 135–145)

## 2020-01-07 LAB — GLUCOSE, CAPILLARY
Glucose-Capillary: 106 mg/dL — ABNORMAL HIGH (ref 70–99)
Glucose-Capillary: 119 mg/dL — ABNORMAL HIGH (ref 70–99)
Glucose-Capillary: 121 mg/dL — ABNORMAL HIGH (ref 70–99)
Glucose-Capillary: 129 mg/dL — ABNORMAL HIGH (ref 70–99)
Glucose-Capillary: 129 mg/dL — ABNORMAL HIGH (ref 70–99)
Glucose-Capillary: 136 mg/dL — ABNORMAL HIGH (ref 70–99)

## 2020-01-07 LAB — TRIGLYCERIDES: Triglycerides: 146 mg/dL (ref <150)

## 2020-01-07 MED ORDER — POTASSIUM CL IN DEXTROSE 5% 20 MEQ/L IV SOLN
20.0000 meq | INTRAVENOUS | Status: DC
  Administered 2020-01-07 – 2020-01-08 (×3): 20 meq via INTRAVENOUS
  Filled 2020-01-07 (×2): qty 1000

## 2020-01-07 MED ORDER — CHLORHEXIDINE GLUCONATE 0.12 % MT SOLN
OROMUCOSAL | Status: AC
  Administered 2020-01-07: 15 mL
  Filled 2020-01-07: qty 15

## 2020-01-07 MED ORDER — POTASSIUM CHLORIDE 20 MEQ/15ML (10%) PO SOLN
20.0000 meq | ORAL | Status: DC
  Filled 2020-01-07: qty 15

## 2020-01-07 MED ORDER — POTASSIUM CL IN DEXTROSE 5% 20 MEQ/L IV SOLN
20.0000 meq | INTRAVENOUS | Status: DC
  Administered 2020-01-07: 20 meq via INTRAVENOUS
  Filled 2020-01-07: qty 1000

## 2020-01-07 MED ORDER — FREE WATER
200.0000 mL | Freq: Four times a day (QID) | Status: DC
  Administered 2020-01-07 – 2020-01-08 (×3): 200 mL

## 2020-01-07 MED ORDER — SODIUM CHLORIDE 0.9 % IV SOLN
3.0000 g | Freq: Two times a day (BID) | INTRAVENOUS | Status: DC
  Administered 2020-01-07 – 2020-01-08 (×2): 3 g via INTRAVENOUS
  Filled 2020-01-07: qty 3
  Filled 2020-01-07: qty 8
  Filled 2020-01-07: qty 3

## 2020-01-07 NOTE — Progress Notes (Signed)
Pharmacy Antibiotic Note  Devin Gray is a 64 y.o. male admitted on 12/23/2019 s/p cardiac arrest, vomit in airway >> concern for aspiration pneumonia.  Pharmacy has been consulted for Unasyn dosing. Scr increased and UOP decreased yesterday and patient was adjusted to Unasyn 3g IV q24h. Today, Scr slightly improved and UOP improved - 2450 ml out (1.4 ml/kg/hr). Current CrCl is ~28 ml/min. Respiratory culture obtained yesterday growing moderate GNR and culture is pending. WBC wnl. Afebrile.   Plan: Adjust Unasyn to 3g IV q12h Follow up clinical progression and length of therapy   Height: 5\' 9"  (175.3 cm) Weight: 166 lb 0.1 oz (75.3 kg) IBW/kg (Calculated) : 70.7  Temp (24hrs), Avg:98.6 F (37 C), Min:98.4 F (36.9 C), Max:98.8 F (37.1 C)  Recent Labs  Lab 12/31/2019 0509 12/30/2019 0527 12/18/2019 0723 12/26/2019 1152 12/22/2019 2102 12/31/2019 2343 01/05/20 0346 01/05/20 0929 01/06/20 0207 01/06/20 0419 01/06/20 1300 01/06/20 1700 01/07/20 0457  WBC 18.8*  --  8.0  --   --   --  8.1  --   --  14.8*  --   --  6.8  CREATININE 1.85*   < > 1.58*   < >   < >  --  1.86*   < > 2.68* 2.92* 2.84* 2.79* 2.61*  LATICACIDVEN  --   --   --   --   --  1.7  --   --   --   --   --   --   --    < > = values in this interval not displayed.    Estimated Creatinine Clearance: 28.6 mL/min (A) (by C-G formula based on SCr of 2.61 mg/dL (H)).    No Known Allergies   Thank you for allowing pharmacy to be a part of this patient's care.  01/13/2020, PharmD PGY1 Pharmacy Resident [employer]: [phone]  01/07/2020

## 2020-01-07 NOTE — Progress Notes (Signed)
RT NOTE: Apnea test performed. Pt was taken off ventilator and placed on 8 L through the ETT, cuff was deflated. Around 2 minutes into test, pt had spontaneous breaths and the apnea test was stopped. No ABG per MD. Pt was placed back on ventilator and cuff was inflated (30 cmh20). Vitals are stable. Rt will continue to monitor.

## 2020-01-07 NOTE — Progress Notes (Signed)
PULMONARY / CRITICAL CARE MEDICINE   NAME:  Devin Gray, MRN:  [id number], DOB:  [birth year], LOS: 3 ADMISSION DATE:  12/15/2019, CONSULTATION DATE:  12/30/2019 REFERRING MD:  Ezequiel Essex, MD CHIEF COMPLAINT:  Cardiac arrest  BRIEF HISTORY:    64 year old male with unknown PMHx presenting s/p cardiac arrest suspected to be secondary to heroin overdose. Possible down time of up to 2 hours after which he received 15 minutes of CPR and 3 rounds of epinephrine, no shock as patient was asystole on EMS arival. Patient brought in on epinephrine infusion that was discontinued in the ED. Patient unresponsive w/fixed and dilated pupils but with spontaneous respirations.  SIGNIFICANT PAST MEDICAL HISTORY   No known past medical history  SIGNIFICANT EVENTS:  3/21> admitted s/p cardiac arrest started on TTM  STUDIES:   3/21 CT Head >> no acute intracranial abnormality; chronic left frontal lobe encephalomalacia 3/22 EEG >> diffuse encephalopathy, nonspecific to etiology, no seizures or definite epileptiform discharges   CULTURES:  3/21 SARS CoV2 and influenza A/B - negative  ANTIBIOTICS:  Zosyn 3/20 Unasyn 3/21>>  LINES/TUBES:  ETT 3/21>> Left subclavian CVC 3/21>>   CONSULTANTS:  None SUBJECTIVE:  Patient is currently off sedation, continues to be unresponsive and on mechanical ventilation.    CONSTITUTIONAL: BP (!) 134/109   Pulse 63   Temp 98.8 F (37.1 C) (Bladder)   Resp (!) 24   Ht 5\' 9"  (1.753 m)   Wt 75.3 kg   SpO2 100%   BMI 24.51 kg/m   I/O last 3 completed shifts: In: 7281 [P.O.:1100; I.V.:2218.4; Other:1000; NG/GT:2762.6; IV Piggyback:200] Out: 4360 [Urine:2910; Stool:1450]  CVP:  [6 mmHg-8 mmHg] 6 mmHg  Vent Mode: PRVC FiO2 (%):  [40 %] 40 % Set Rate:  [18 bmp] 18 bmp Vt Set:  [56 mL-560 mL] 560 mL PEEP:  [10 cmH20] 10 cmH20 Plateau Pressure:  [21 cmH20-23 cmH20] 23 cmH20  PHYSICAL EXAM: General:  Critically ill appearing male who appears to be stated  age; unresponsive to verbal and noxious stimuli and mechanically ventilated Neuro:  Unresponsive to verbal and noxious stimuli mechanically nonreactive pupils, no gag reflex or cough or dolls eyes  HEENT:  Atraumatic, normocephalic, pupils dilated and unresponsive to light bilaterally Cardiovascular:  S1 and S2 present, RRR, no m/r/g Lungs:  Clear to auscultation bilaterally on vent Abdomen:  Nondistended, soft, BSx4  RESOLVED PROBLEM LIST   ASSESSMENT AND PLAN   Acute hypoxic respiratory failure 2/2 cardiac arrest Cardiac arrest likely 2/2 polysubstance use: Presented after at least 2 hours of down time s/p CPR for at least 15 minutes initially found to be in asystole s/p epi x3 and epinephrine gtt. He was initiated on TTM protocol - TTM protocol complete; patient continued in maintenance phase - Continue full vent support - Goal SpO2 >92%  Aspiration pneumonia: Patient noted to have vomit when found. Concern for aspiration pneumonia given elevated PCT and leukocytosis. CXR with perihilar opacities and areas of interstitial prominence possibly sequale of aspiration.  - Unasyn day 4 - CBC daily - f/u respiratory culture  Acute metabolic encephalopathy in setting of cardiac arrest and polysubstance use: Patient is off sedation  However, remains unresponsive to verbal and noxious stimuli, pupils unreactive and without dolls eyes. No cough or gag reflex appreciated. EEG with diffuse encephalopathy, no seizure or epileptiform discharges noted. CT head wnl.  - observe off sedation for any neurologic recovery - MRI brain today - family discussion regarding GOC  Acute renal failure  likely ATN 2/2 cardiac arrest: Hypokalemia:  Hypernatremia: Patient noted to be hyperkalemic on arrival s/p lokelma and bicarb. Hypokalemic this AM in setting of GI losses. Renal function improving and patient with increased urine output over past 24hrs with fluid resuscitation. Likely ATN in setting of cardiac  arrest with prolonged down time.  Free water deficit of 1.2L - Fluid resuscitation - F/u BMP in afternoon - avoid nephrotoxic agents - monitor and replete electrolytes prn  Best Practice / Goals of Care / Disposition.   Diet: feeding supplement per dietician Pain/Anxiety/Delirium protocol (if indicated): RASS goal 0 to -1 VAP protocol (if indicated): Per protocol DVT prophylaxis: SCD's, sq heparin GI prophylaxis: PPI Glucose control: SSI Mobility: Bed bound Code Status: FULL Family Communication: daily family update Disposition: ICU  LABS  Glucose Recent Labs  Lab 01/06/20 1144 01/06/20 1536 01/06/20 1950 01/06/20 2324 01/07/20 0336 01/07/20 0744  GLUCAP 120* 138* 121* 129* 136* 129*    BMET Recent Labs  Lab 01/06/20 1300 01/06/20 1700 01/07/20 0457  NA 155* 155* 148*  K 3.6 3.3* 3.3*  CL 123* 125* 119*  CO2 20* 20* 22  BUN 63* 63* 55*  CREATININE 2.84* 2.79* 2.61*  GLUCOSE 143* 158* 150*    Liver Enzymes Recent Labs  Lab 01/05/20 0929 01/06/20 0419 01/07/20 0457  AST 241* 109* 94*  ALT 204* 140* 75*  ALKPHOS 80 66 59  BILITOT 0.6 0.7 0.6  ALBUMIN 2.7* 2.4* 2.0*    Electrolytes Recent Labs  Lab Jan 22, 2020 2102 01-22-2020 2344 01/05/20 0346 01/05/20 0929 01/05/20 1700 01/05/20 1803 01/06/20 1300 01/06/20 1700 01/07/20 0457  CALCIUM   < >  --  7.4*   < > 7.2*   < > 7.3* 7.3* 7.5*  MG  --  2.3 2.1  --  2.1  --   --   --   --   PHOS  --  4.9* 2.9  --  4.4  --   --   --   --    < > = values in this interval not displayed.    CBC Recent Labs  Lab 01/05/20 0346 01/05/20 0346 01/05/20 0502 01/06/20 0419 01/07/20 0457  WBC 8.1  --   --  14.8* 6.8  HGB 16.3   < > 16.3 14.8 12.6*  HCT 50.1   < > 48.0 46.1 39.7  PLT 108*  --   --  87* 61*   < > = values in this interval not displayed.    ABG Recent Labs  Lab 01/22/2020 1715 Jan 22, 2020 2039 01/05/20 0502  PHART 7.232* 7.291* 7.311*  PCO2ART 46.5 34.1 36.7  PO2ART 68.0* 107.0 53.0*     Coag's Recent Labs  Lab January 22, 2020 0723 22-Jan-2020 1512 01/05/20 0346  APTT 39* 33  --   INR 1.3* 1.6* 1.4*    Sepsis Markers Recent Labs  Lab 2020-01-22 0723 01-22-2020 2343 01/05/20 0346 01/06/20 0419  LATICACIDVEN  --  1.7  --   --   PROCALCITON 0.46  --  37.70 34.77    Cardiac Enzymes No results for input(s): TROPONINI, PROBNP in the last 168 hours.   Critical care time: 35 minutes  Eliezer Bottom, MD Internal Medicine, PGY-1 01/07/20 10:34 AM

## 2020-01-07 NOTE — Progress Notes (Signed)
RT NOTE: RT transported patient on ventilator from room 2H15 to MRI and back to room 2H15 with no complications. Vitals are stable. RT will continue to monitor.

## 2020-01-07 NOTE — Progress Notes (Signed)
eLink Physician-Brief Progress Note Patient Name: Devin Gray DOB: 09/16/1956 MRN: [id number]   Date of Service  01/07/2020  HPI/Events of Note  K+ 3.3  eICU Interventions  KCL 20 meq via NG tube Q 4 hours x 2 doses        Devin Gray Devin Gray 01/07/2020, 6:43 AM

## 2020-01-07 NOTE — Significant Event (Incomplete)
Declaration of death by neurological criteria: ? ?Diagnosis: Anoxic injury post cardiac arrest. ? ?Imaging: MRI is  consistent with hypoxic/ischemic injury to the ?supratentorial and infratentorial brain  ? ?Confounding factors: none - now 48h post TTM, no sedation for 48h. ? ?Clinical examination: ? ? Response to painful stimulation to 4 limbs: none ? Response to painful central stimuli: none ? Pupillary response: fixed ? Corneal response: absent ? Oculocephalic response: absent ? Vestibulo-occular response: absent ? Gag reflex: none ? Cough reflex: none ? ? ?Apnea testing: an 8 min apnea test was performed.  ?  ?Blood gas results: ? ?ABG ?   ?Component Value Date/Time  ? PHART 7.320 (L) 01/07/2020 1729  ? PCO2ART 45.6 01/07/2020 1729  ? PO2ART 109.0 (H) 01/07/2020 1729  ? HCO3 23.4 01/07/2020 1729  ? TCO2 25 01/07/2020 1729  ? ACIDBASEDEF 3.0 (H) 01/07/2020 1729  ? O2SAT 98.0 01/07/2020 1729  ?  ? ?Confirmatory testing: *** ? ? ?The patient was declared dead by neurological criteria at *** ? ?[place] [facility] was notified. ? ? ?01/07/2020, 6:09 PM ? ? ? ? ?  ? ?

## 2020-01-08 ENCOUNTER — Inpatient Hospital Stay (HOSPITAL_COMMUNITY): Payer: Medicaid Other

## 2020-01-08 ENCOUNTER — Encounter (HOSPITAL_COMMUNITY): Payer: Self-pay | Admitting: Anesthesiology

## 2020-01-08 LAB — URINALYSIS, ROUTINE W REFLEX MICROSCOPIC
Bilirubin Urine: NEGATIVE
Glucose, UA: 150 mg/dL — AB
Hgb urine dipstick: NEGATIVE
Ketones, ur: NEGATIVE mg/dL
Nitrite: NEGATIVE
Protein, ur: 30 mg/dL — AB
Specific Gravity, Urine: 1.015 (ref 1.005–1.030)
pH: 5 (ref 5.0–8.0)

## 2020-01-08 LAB — CBC WITH DIFFERENTIAL/PLATELET
Abs Immature Granulocytes: 0.05 10*3/uL (ref 0.00–0.07)
Abs Immature Granulocytes: 0.08 10*3/uL — ABNORMAL HIGH (ref 0.00–0.07)
Basophils Absolute: 0 10*3/uL (ref 0.0–0.1)
Basophils Absolute: 0 10*3/uL (ref 0.0–0.1)
Basophils Relative: 1 %
Basophils Relative: 0 %
Eosinophils Absolute: 0.1 10*3/uL (ref 0.0–0.5)
Eosinophils Absolute: 0.1 10*3/uL (ref 0.0–0.5)
Eosinophils Relative: 1 %
Eosinophils Relative: 1 %
HCT: 37.6 % — ABNORMAL LOW (ref 39.0–52.0)
HCT: 40.4 % (ref 39.0–52.0)
Hemoglobin: 11.8 g/dL — ABNORMAL LOW (ref 13.0–17.0)
Hemoglobin: 12.9 g/dL — ABNORMAL LOW (ref 13.0–17.0)
Immature Granulocytes: 1 %
Immature Granulocytes: 1 %
Lymphocytes Relative: 13 %
Lymphocytes Relative: 10 %
Lymphs Abs: 0.9 10*3/uL (ref 0.7–4.0)
Lymphs Abs: 0.9 10*3/uL (ref 0.7–4.0)
MCH: 30 pg (ref 26.0–34.0)
MCH: 30 pg (ref 26.0–34.0)
MCHC: 31.4 g/dL (ref 30.0–36.0)
MCHC: 31.9 g/dL (ref 30.0–36.0)
MCV: 95.7 fL (ref 80.0–100.0)
MCV: 94 fL (ref 80.0–100.0)
Monocytes Absolute: 0.6 10*3/uL (ref 0.1–1.0)
Monocytes Absolute: 0.8 10*3/uL (ref 0.1–1.0)
Monocytes Relative: 9 %
Monocytes Relative: 9 %
Neutro Abs: 5.4 10*3/uL (ref 1.7–7.7)
Neutro Abs: 7.4 10*3/uL (ref 1.7–7.7)
Neutrophils Relative %: 75 %
Neutrophils Relative %: 79 %
Platelets: 57 10*3/uL — ABNORMAL LOW (ref 150–400)
Platelets: 67 10*3/uL — ABNORMAL LOW (ref 150–400)
RBC: 3.93 MIL/uL — ABNORMAL LOW (ref 4.22–5.81)
RBC: 4.3 MIL/uL (ref 4.22–5.81)
RDW: 14.9 % (ref 11.5–15.5)
RDW: 15 % (ref 11.5–15.5)
WBC: 7.1 10*3/uL (ref 4.0–10.5)
WBC: 9.3 10*3/uL (ref 4.0–10.5)
nRBC: 0.3 % — ABNORMAL HIGH (ref 0.0–0.2)
nRBC: 0 % (ref 0.0–0.2)

## 2020-01-08 LAB — POCT I-STAT 7, (LYTES, BLD GAS, ICA,H+H)
Acid-Base Excess: 1 mmol/L (ref 0.0–2.0)
Bicarbonate: 25.7 mmol/L (ref 20.0–28.0)
Calcium, Ion: 1.31 mmol/L (ref 1.15–1.40)
HCT: 33 % — ABNORMAL LOW (ref 39.0–52.0)
Hemoglobin: 11.2 g/dL — ABNORMAL LOW (ref 13.0–17.0)
O2 Saturation: 95 %
Potassium: 3.6 mmol/L (ref 3.5–5.1)
Sodium: 151 mmol/L — ABNORMAL HIGH (ref 135–145)
TCO2: 27 mmol/L (ref 22–32)
pCO2 arterial: 42.5 mmHg (ref 32.0–48.0)
pH, Arterial: 7.39 (ref 7.350–7.450)
pO2, Arterial: 74 mmHg — ABNORMAL LOW (ref 83.0–108.0)

## 2020-01-08 LAB — COMPREHENSIVE METABOLIC PANEL
ALT: 49 U/L — ABNORMAL HIGH (ref 0–44)
ALT: 46 U/L — ABNORMAL HIGH (ref 0–44)
ALT: 39 U/L (ref 0–44)
AST: 68 U/L — ABNORMAL HIGH (ref 15–41)
AST: 57 U/L — ABNORMAL HIGH (ref 15–41)
AST: 53 U/L — ABNORMAL HIGH (ref 15–41)
Albumin: 1.9 g/dL — ABNORMAL LOW (ref 3.5–5.0)
Albumin: 2.1 g/dL — ABNORMAL LOW (ref 3.5–5.0)
Albumin: 2 g/dL — ABNORMAL LOW (ref 3.5–5.0)
Alkaline Phosphatase: 52 U/L (ref 38–126)
Alkaline Phosphatase: 57 U/L (ref 38–126)
Alkaline Phosphatase: 46 U/L (ref 38–126)
Anion gap: 7 (ref 5–15)
Anion gap: 7 (ref 5–15)
Anion gap: 5 (ref 5–15)
BUN: 46 mg/dL — ABNORMAL HIGH (ref 8–23)
BUN: 39 mg/dL — ABNORMAL HIGH (ref 8–23)
BUN: 37 mg/dL — ABNORMAL HIGH (ref 8–23)
CO2: 24 mmol/L (ref 22–32)
CO2: 25 mmol/L (ref 22–32)
CO2: 25 mmol/L (ref 22–32)
Calcium: 8.2 mg/dL — ABNORMAL LOW (ref 8.9–10.3)
Calcium: 8.8 mg/dL — ABNORMAL LOW (ref 8.9–10.3)
Calcium: 8.6 mg/dL — ABNORMAL LOW (ref 8.9–10.3)
Chloride: 116 mmol/L — ABNORMAL HIGH (ref 98–111)
Chloride: 118 mmol/L — ABNORMAL HIGH (ref 98–111)
Chloride: 121 mmol/L — ABNORMAL HIGH (ref 98–111)
Creatinine, Ser: 1.99 mg/dL — ABNORMAL HIGH (ref 0.61–1.24)
Creatinine, Ser: 1.78 mg/dL — ABNORMAL HIGH (ref 0.61–1.24)
Creatinine, Ser: 1.73 mg/dL — ABNORMAL HIGH (ref 0.61–1.24)
GFR calc Af Amer: 40 mL/min — ABNORMAL LOW (ref >60)
GFR calc Af Amer: 46 mL/min — ABNORMAL LOW (ref >60)
GFR calc Af Amer: 47 mL/min — ABNORMAL LOW (ref >60)
GFR calc non Af Amer: 34 mL/min — ABNORMAL LOW (ref >60)
GFR calc non Af Amer: 39 mL/min — ABNORMAL LOW (ref >60)
GFR calc non Af Amer: 41 mL/min — ABNORMAL LOW (ref >60)
Glucose, Bld: 125 mg/dL — ABNORMAL HIGH (ref 70–99)
Glucose, Bld: 118 mg/dL — ABNORMAL HIGH (ref 70–99)
Glucose, Bld: 111 mg/dL — ABNORMAL HIGH (ref 70–99)
Potassium: 3.2 mmol/L — ABNORMAL LOW (ref 3.5–5.1)
Potassium: 3.8 mmol/L (ref 3.5–5.1)
Potassium: 3.5 mmol/L (ref 3.5–5.1)
Sodium: 147 mmol/L — ABNORMAL HIGH (ref 135–145)
Sodium: 150 mmol/L — ABNORMAL HIGH (ref 135–145)
Sodium: 151 mmol/L — ABNORMAL HIGH (ref 135–145)
Total Bilirubin: 0.5 mg/dL (ref 0.3–1.2)
Total Bilirubin: 0.5 mg/dL (ref 0.3–1.2)
Total Bilirubin: 0.6 mg/dL (ref 0.3–1.2)
Total Protein: 4.7 g/dL — ABNORMAL LOW (ref 6.5–8.1)
Total Protein: 5.2 g/dL — ABNORMAL LOW (ref 6.5–8.1)
Total Protein: 5 g/dL — ABNORMAL LOW (ref 6.5–8.1)

## 2020-01-08 LAB — ABO/RH: ABO/RH(D): O POS

## 2020-01-08 LAB — BILIRUBIN, DIRECT: Bilirubin, Direct: <0.1 mg/dL (ref 0.0–0.2)

## 2020-01-08 LAB — APTT: aPTT: 31 seconds (ref 24–36)

## 2020-01-08 LAB — CULTURE, RESPIRATORY W GRAM STAIN: Gram Stain: NONE SEEN

## 2020-01-08 LAB — PROTIME-INR
INR: 1.1 (ref 0.8–1.2)
Prothrombin Time: 13.6 seconds (ref 11.4–15.2)

## 2020-01-08 LAB — GLUCOSE, CAPILLARY
Glucose-Capillary: 120 mg/dL — ABNORMAL HIGH (ref 70–99)
Glucose-Capillary: 139 mg/dL — ABNORMAL HIGH (ref 70–99)
Glucose-Capillary: 120 mg/dL — ABNORMAL HIGH (ref 70–99)
Glucose-Capillary: 99 mg/dL (ref 70–99)
Glucose-Capillary: 107 mg/dL — ABNORMAL HIGH (ref 70–99)

## 2020-01-08 LAB — SARS CORONAVIRUS 2 (TAT 6-24 HRS): SARS Coronavirus 2: NEGATIVE

## 2020-01-08 LAB — FIBRINOGEN: Fibrinogen: 631 mg/dL — ABNORMAL HIGH (ref 210–475)

## 2020-01-08 MED ORDER — GLYCOPYRROLATE 1 MG PO TABS
1.0000 mg | ORAL_TABLET | ORAL | Status: DC | PRN
  Filled 2020-01-08: qty 1

## 2020-01-08 MED ORDER — DEXTROSE-NACL 5-0.9 % IV SOLN
INTRAVENOUS | Status: DC
  Administered 2020-01-08: 450 mL via INTRAVENOUS

## 2020-01-08 MED ORDER — ONDANSETRON 4 MG PO TBDP
4.0000 mg | ORAL_TABLET | Freq: Four times a day (QID) | ORAL | Status: DC | PRN
  Filled 2020-01-08: qty 1

## 2020-01-08 MED ORDER — ACETAMINOPHEN 650 MG RE SUPP: 650.0000 mg | Freq: Four times a day (QID) | RECTAL | Status: DC | PRN

## 2020-01-08 MED ORDER — MORPHINE BOLUS VIA INFUSION
2.0000 mg | INTRAVENOUS | Status: DC | PRN
  Filled 2020-01-08: qty 2

## 2020-01-08 MED ORDER — MORPHINE 100MG IN NS 100ML (1MG/ML) PREMIX INFUSION
5.0000 mg/h | INTRAVENOUS | Status: DC
  Administered 2020-01-08 – 2020-01-09 (×3): 5 mg/h via INTRAVENOUS
  Filled 2020-01-08 (×3): qty 100

## 2020-01-08 MED ORDER — POTASSIUM CHLORIDE 20 MEQ/15ML (10%) PO SOLN
20.0000 meq | ORAL | Status: AC
  Administered 2020-01-08 (×2): 20 meq
  Filled 2020-01-08: qty 15

## 2020-01-08 MED ORDER — GLYCOPYRROLATE 0.2 MG/ML IJ SOLN: 0.2000 mg | INTRAMUSCULAR | Status: DC | PRN

## 2020-01-08 MED ORDER — POLYVINYL ALCOHOL 1.4 % OP SOLN
1.0000 [drp] | Freq: Four times a day (QID) | OPHTHALMIC | Status: DC | PRN
  Filled 2020-01-08: qty 15

## 2020-01-08 MED ORDER — GLYCOPYRROLATE 0.2 MG/ML IJ SOLN
0.2000 mg | INTRAMUSCULAR | Status: DC | PRN
  Filled 2020-01-08: qty 1

## 2020-01-08 MED ORDER — BIOTENE DRY MOUTH MT LIQD: 15.0000 mL | OROMUCOSAL | Status: DC | PRN

## 2020-01-08 MED ORDER — ONDANSETRON HCL 4 MG/2ML IJ SOLN: 4.0000 mg | Freq: Four times a day (QID) | INTRAMUSCULAR | Status: DC | PRN

## 2020-01-08 MED ORDER — ACETAMINOPHEN 325 MG PO TABS: 650.0000 mg | ORAL_TABLET | Freq: Four times a day (QID) | ORAL | Status: DC | PRN

## 2020-01-08 NOTE — Procedures (Signed)
Arterial Catheter Insertion Procedure Note KAINEN STRUCKMAN [id number] [birth year]  Procedure: Insertion of Arterial Catheter  Indications: Blood pressure monitoring  Procedure Details Consent: Unable to obtain consent because of altered level of consciousness. Time Out: Verified patient identification, verified procedure, site/side was marked, verified correct patient position, special equipment/implants available, medications/allergies/relevent history reviewed, required imaging and test results available.  Performed  Maximum sterile technique was used including antiseptics, gloves, hand hygiene, mask and sheet. Skin prep: Chlorhexidine; local anesthetic administered 20 gauge catheter was inserted into right radial artery using the Seldinger technique. Bedside POCUS utilized for visual access.   Evaluation Blood flow good; BP tracing good. Complications: No apparent complications.   Eliezer Bottom, MD Internal Medicine, PGY-1 01/08/2020 3:44 PM  Performed under the supervision and guidance of Zenia Resides, NP.

## 2020-01-08 NOTE — Progress Notes (Signed)
Recruitment maneuver performed at this time, no complications noted. PC of 25 PEEP of 5 RR of 10 FIO2 100% I time 3.0 seconds.

## 2020-01-08 NOTE — Progress Notes (Signed)
Wasted 50ml of fentanyl from bag and 10 ml of versed from bottle in stericycle with Rachel Stapleton RN ?

## 2020-01-08 NOTE — Progress Notes (Signed)
PULMONARY / CRITICAL CARE MEDICINE   NAME:  Devin Gray, MRN:  [id number], DOB:  1955-12-20, LOS: 4 ADMISSION DATE:  01/13/2020, CONSULTATION DATE:  01/11/2020 REFERRING MD:  Glynn Octave, MD CHIEF COMPLAINT:  Cardiac arrest  BRIEF HISTORY:    64 year old male with unknown PMHx presenting s/p cardiac arrest suspected to be secondary to heroin overdose. Possible down time of up to 2 hours after which he received 15 minutes of CPR and 3 rounds of epinephrine, no shock as patient was asystole on EMS arival. Patient brought in on epinephrine infusion that was discontinued in the ED. Patient unresponsive w/fixed and dilated pupils but with spontaneous respirations.  SIGNIFICANT PAST MEDICAL HISTORY   No known past medical history  SIGNIFICANT EVENTS:  3/21> admitted s/p cardiac arrest started on TTM 3/25 > family elected for comfort care STUDIES:   3/21 CT Head >> no acute intracranial abnormality; chronic left frontal lobe encephalomalacia 3/22 EEG >> diffuse encephalopathy, nonspecific to etiology, no seizures or definite epileptiform discharges  3/24 MR Brain >> hypoxic/ischemic injury to supratentorial and infratentorial brain; small foci of chronic encephalomalacia within anterior left frontal lobe and anteroinferior right frontal lobe CULTURES:  3/21 SARS CoV2 and influenza A/B - negative 3/23 Respiratory culture > Enterobacter species, R-cefazolin ANTIBIOTICS:  Zosyn 3/20 Unasyn 3/21>>3/25  LINES/TUBES:  ETT 3/21>>3/25 Left subclavian CVC 3/21>>   CONSULTANTS:  None SUBJECTIVE:  Patient is currently off sedation, continues to be unresponsive and on mechanical ventilation.    CONSTITUTIONAL: BP (!) 161/83   Pulse 63   Temp 98.6 F (37 C) (Bladder)   Resp 18   Ht 5\' 9"  (1.753 m)   Wt 77.8 kg   SpO2 95%   BMI 25.33 kg/m   I/O last 3 completed shifts: In: 6539.7 [I.V.:3412.1; NG/GT:2827.6; IV Piggyback:300] Out: 4035 [Urine:2915; Stool:1120]  CVP:  [3 mmHg-9 mmHg]  9 mmHg  Vent Mode: PRVC FiO2 (%):  [40 %-60 %] 60 % Set Rate:  [18 bmp] 18 bmp Vt Set:  [560 mL] 560 mL PEEP:  [10 cmH20] 10 cmH20 Plateau Pressure:  [21 cmH20-23 cmH20] 21 cmH20  PHYSICAL EXAM: General:  Critically ill appearing male who appears to be stated age; unresponsive to verbal and noxious stimuli and mechanically ventilated Neuro:  Unresponsive to verbal and noxious stimuli mechanically nonreactive pupils, no gag reflex or cough or dolls eyes  HEENT:  Atraumatic, normocephalic, pupils dilated and unresponsive to light bilaterally Cardiovascular:  S1 and S2 present, RRR, no m/r/g Lungs:  Clear to auscultation bilaterally on vent Abdomen:  Nondistended, soft, BSx4  RESOLVED PROBLEM LIST   ASSESSMENT AND PLAN   Acute hypoxic respiratory failure 2/2 cardiac arrest Cardiac arrest likely 2/2 polysubstance use: Presented after at least 2 hours of down time s/p CPR for at least 15 minutes initially found to be in asystole s/p epi x3 and epinephrine gtt. He was initiated on TTM protocol - TTM protocol complete; patient continued in maintenance phase - Patient for comfort care per family request   Aspiration pneumonia: Patient noted to have vomit when found. Concern for aspiration pneumonia given elevated PCT and leukocytosis. CXR with perihilar opacities and areas of interstitial prominence possibly sequale of aspiration. Respiratory culture with enterobacter species, resistant only to cefazolin. Patient has completed 4 days of unasyn.  - Family elected for comfort care  Acute metabolic encephalopathy in setting of cardiac arrest and polysubstance use: Patient is off sedation  However, remains unresponsive to verbal and noxious stimuli, pupils unreactive  and without dolls eyes. No cough or gag reflex appreciated. EEG with diffuse encephalopathy, no seizure or epileptiform discharges noted. CT head wnl. MRI with ischemic/hypoxic injury in supratentorial and infratentorial region.  Patient failed apnea test yesterday. Discussion with family yesterday regarding poor neurologic prognosis. Family elected for comfort care this morning.  - Comfort care   Acute renal failure likely ATN 2/2 cardiac arrest: Hypokalemia:  Hypernatremia: Patient noted to be hyperkalemic on arrival s/p lokelma and bicarb. Hypokalemic this AM in setting of GI losses. Renal function improving and patient with increased urine output over past 24hrs with fluid resuscitation. Likely ATN in setting of cardiac arrest with prolonged down time.  - Comfort care   Best Practice / Goals of Care / Disposition.   Diet: NPO Pain/Anxiety/Delirium protocol (if indicated): per protocol VAP protocol (if indicated): Per protocol DVT prophylaxis: SCD's, sq heparin GI prophylaxis: PPI Glucose control: SSI Mobility: Bed bound Code Status: DNR Family Communication: daily family update Disposition: ICU  LABS  Glucose Recent Labs  Lab 01/07/20 1325 01/07/20 1545 01/07/20 1923 01/07/20 2336 01/08/20 0345 01/08/20 0830  GLUCAP 106* 119* 121* 129* 120* 139*    BMET Recent Labs  Lab 01/07/20 0457 01/07/20 0457 01/07/20 1323 01/07/20 1729 01/08/20 0405  NA 148*   < > 149* 150* 147*  K 3.3*   < > 3.1* 3.0* 3.2*  CL 119*  --  118*  --  116*  CO2 22  --  22  --  24  BUN 55*  --  53*  --  46*  CREATININE 2.61*  --  2.31*  --  1.99*  GLUCOSE 150*  --  107*  --  125*   < > = values in this interval not displayed.    Liver Enzymes Recent Labs  Lab 01/06/20 0419 01/07/20 0457 01/08/20 0405  AST 109* 94* 68*  ALT 140* 75* 49*  ALKPHOS 66 59 52  BILITOT 0.7 0.6 0.5  ALBUMIN 2.4* 2.0* 1.9*    Electrolytes Recent Labs  Lab 12/28/2019 2102 01/07/2020 2344 01/05/20 0346 01/05/20 0929 01/05/20 1700 01/05/20 1803 01/07/20 0457 01/07/20 1323 01/08/20 0405  CALCIUM   < >  --  7.4*   < > 7.2*   < > 7.5* 7.8* 8.2*  MG  --  2.3 2.1  --  2.1  --   --   --   --   PHOS  --  4.9* 2.9  --  4.4  --   --    --   --    < > = values in this interval not displayed.    CBC Recent Labs  Lab 01/06/20 0419 01/06/20 0419 01/07/20 0457 01/07/20 1729 01/08/20 0405  WBC 14.8*  --  6.8  --  7.1  HGB 14.8   < > 12.6* 11.9* 11.8*  HCT 46.1   < > 39.7 35.0* 37.6*  PLT 87*  --  61*  --  57*   < > = values in this interval not displayed.    ABG Recent Labs  Lab 01/03/2020 2039 01/05/20 0502 01/07/20 1729  PHART 7.291* 7.311* 7.320*  PCO2ART 34.1 36.7 45.6  PO2ART 107.0 53.0* 109.0*    Coag's Recent Labs  Lab 12/21/2019 0723 12/22/2019 1512 01/05/20 0346  APTT 39* 33  --   INR 1.3* 1.6* 1.4*    Sepsis Markers Recent Labs  Lab 12/25/2019 0723 12/21/2019 2343 01/05/20 0346 01/06/20 0419  LATICACIDVEN  --  1.7  --   --  PROCALCITON 0.46  --  37.70 34.77    Cardiac Enzymes No results for input(s): TROPONINI, PROBNP in the last 168 hours.   Critical care time: 35 minutes  Harvie Heck, MD Internal Medicine, PGY-1 01/08/20 9:42 AM

## 2020-01-08 NOTE — Plan of Care (Signed)
Pt remains at GCS of 3, no movement to painful stimuli, pupils non reactive, no cough or gag reflexes noted. Pt has generalized moderate edema. UOP is adequate and flexiseal remains in place with runny stools. Oxygen saturation has been maintained in mid 90s on 60% fio2. No signs of pain noted. TTM pads remain on and pt is normothermic. Problem: Clinical Measurements: Goal: Ability to maintain clinical measurements within normal limits will improve Outcome: Not Progressing   Problem: Nutrition: Goal: Adequate nutrition will be maintained Outcome: Progressing   Problem: Elimination: Goal: Will not experience complications related to bowel motility Outcome: Progressing   Problem: Safety: Goal: Ability to remain free from injury will improve Outcome: Progressing

## 2020-01-08 NOTE — Progress Notes (Signed)
RT NOTE: Recruitment maneuver performed per CDS with following settings: PC mode, PC of 25, PEEP 5, RR 10, 100% FIO2, and I time of 3.0 seconds. Patient tolerated well for full 2 minutes. Vitals are stable. RT will continue to monitor.  

## 2020-01-08 NOTE — Progress Notes (Signed)
RT NOTE: Recruitment maneuver performed per CDS with following settings: PC mode with PC of 25. PEEP 5, RR 10, 100% FIO2, and I time of 3.0 seconds. Patient tolerated well for full 2 minutes. Vitals are stable. RT will continue to monitor.

## 2020-01-08 NOTE — Progress Notes (Signed)
RT NOTE:  Recruitment maneuver performed X 2 minutes at this time, no complications noted. PC 25, PEEP 5, RR 10, FIO2 100%, I time 3.0 seconds.

## 2020-01-08 NOTE — Progress Notes (Signed)
RT NOTE: Recruitment maneuver performed per CDS with following settings: PC mode, PC of 25, PEEP 5, RR 10, 100% FIO2, and I time of 3.0 seconds. Patient tolerated well for full 2 minutes. Vitals are stable. RT will continue to monitor.

## 2020-01-08 NOTE — Progress Notes (Signed)
eLink Physician-Brief Progress Note Patient Name: Devin Gray DOB: 1956/07/12 MRN: [id number]   Date of Service  01/08/2020  HPI/Events of Note  K+ 3.2  eICU Interventions  KCL 20 meq via NG tube Q 4 hours x 2 doses. , maintenance iv fluids switched to non-potassium containing iv fluids.        [name] 01/08/2020, 5:52 AM

## 2020-01-08 NOTE — Progress Notes (Signed)
Wasted 80ml of fentanyl from bag and 10 ml of versed from bottle in stericycle with Katy Apo RN ?

## 2020-01-08 NOTE — Progress Notes (Signed)
RT NOTE: RT transported patient on ventilator from room 2H15 to CT and back to room 2H15 with no complications. Vitals are stable. RT will continue to monitor.  

## 2020-01-08 NOTE — Progress Notes (Signed)
Nutrition Brief Note  Chart reviewed. Pt now transitioning to comfort care.  No further nutrition interventions warranted at this time.  Please re-consult as needed.   Tedi Hughson RD, LDN Clinical Nutrition Pager listed in [facility]ION    

## 2020-01-09 ENCOUNTER — Encounter (HOSPITAL_COMMUNITY): Admission: EM | Disposition: E | Payer: Self-pay | Source: Home / Self Care | Attending: Pulmonary Disease

## 2020-01-09 ENCOUNTER — Inpatient Hospital Stay (HOSPITAL_COMMUNITY): Payer: Medicaid Other

## 2020-01-09 HISTORY — PX: ORGAN PROCUREMENT: SHX5270

## 2020-01-09 LAB — BASIC METABOLIC PANEL
Anion gap: 11 (ref 5–15)
BUN: 38 mg/dL — ABNORMAL HIGH (ref 8–23)
CO2: 24 mmol/L (ref 22–32)
Calcium: 8.9 mg/dL (ref 8.9–10.3)
Chloride: 119 mmol/L — ABNORMAL HIGH (ref 98–111)
Creatinine, Ser: 1.67 mg/dL — ABNORMAL HIGH (ref 0.61–1.24)
GFR calc Af Amer: 49 mL/min — ABNORMAL LOW (ref >60)
GFR calc non Af Amer: 43 mL/min — ABNORMAL LOW (ref >60)
Glucose, Bld: 134 mg/dL — ABNORMAL HIGH (ref 70–99)
Potassium: 3.5 mmol/L (ref 3.5–5.1)
Sodium: 154 mmol/L — ABNORMAL HIGH (ref 135–145)

## 2020-01-09 LAB — COMPREHENSIVE METABOLIC PANEL
ALT: 37 U/L (ref 0–44)
AST: 49 U/L — ABNORMAL HIGH (ref 15–41)
Albumin: 2.1 g/dL — ABNORMAL LOW (ref 3.5–5.0)
Alkaline Phosphatase: 50 U/L (ref 38–126)
Anion gap: 9 (ref 5–15)
BUN: 38 mg/dL — ABNORMAL HIGH (ref 8–23)
CO2: 24 mmol/L (ref 22–32)
Calcium: 8.8 mg/dL — ABNORMAL LOW (ref 8.9–10.3)
Chloride: 119 mmol/L — ABNORMAL HIGH (ref 98–111)
Creatinine, Ser: 1.92 mg/dL — ABNORMAL HIGH (ref 0.61–1.24)
GFR calc Af Amer: 42 mL/min — ABNORMAL LOW (ref >60)
GFR calc non Af Amer: 36 mL/min — ABNORMAL LOW (ref >60)
Glucose, Bld: 130 mg/dL — ABNORMAL HIGH (ref 70–99)
Potassium: 3.5 mmol/L (ref 3.5–5.1)
Sodium: 152 mmol/L — ABNORMAL HIGH (ref 135–145)
Total Bilirubin: 0.6 mg/dL (ref 0.3–1.2)
Total Protein: 5.2 g/dL — ABNORMAL LOW (ref 6.5–8.1)

## 2020-01-09 LAB — URINALYSIS, ROUTINE W REFLEX MICROSCOPIC
Bilirubin Urine: NEGATIVE
Glucose, UA: 50 mg/dL — AB
Ketones, ur: NEGATIVE mg/dL
Nitrite: NEGATIVE
Protein, ur: 30 mg/dL — AB
Specific Gravity, Urine: 1.016 (ref 1.005–1.030)
pH: 5 (ref 5.0–8.0)

## 2020-01-09 LAB — GLUCOSE, CAPILLARY
Glucose-Capillary: 125 mg/dL — ABNORMAL HIGH (ref 70–99)
Glucose-Capillary: 121 mg/dL — ABNORMAL HIGH (ref 70–99)
Glucose-Capillary: 114 mg/dL — ABNORMAL HIGH (ref 70–99)
Glucose-Capillary: 127 mg/dL — ABNORMAL HIGH (ref 70–99)

## 2020-01-09 LAB — POCT I-STAT 7, (LYTES, BLD GAS, ICA,H+H)
Acid-Base Excess: 4 mmol/L — ABNORMAL HIGH (ref 0.0–2.0)
Bicarbonate: 29.3 mmol/L — ABNORMAL HIGH (ref 20.0–28.0)
Calcium, Ion: 1.31 mmol/L (ref 1.15–1.40)
HCT: 35 % — ABNORMAL LOW (ref 39.0–52.0)
Hemoglobin: 11.9 g/dL — ABNORMAL LOW (ref 13.0–17.0)
O2 Saturation: 100 %
Patient temperature: 99.7
Potassium: 3.6 mmol/L (ref 3.5–5.1)
Sodium: 153 mmol/L — ABNORMAL HIGH (ref 135–145)
TCO2: 31 mmol/L (ref 22–32)
pCO2 arterial: 47.4 mmHg (ref 32.0–48.0)
pH, Arterial: 7.402 (ref 7.350–7.450)
pO2, Arterial: 241 mmHg — ABNORMAL HIGH (ref 83.0–108.0)

## 2020-01-09 LAB — URINE CULTURE: Culture: NO GROWTH

## 2020-01-09 SURGERY — SURGICAL PROCUREMENT, ORGAN
Anesthesia: General | Site: Abdomen

## 2020-01-09 MED ORDER — PIPERACILLIN-TAZOBACTAM 3.375 G IVPB 30 MIN
3.3750 g | Freq: Once | INTRAVENOUS | Status: AC
  Administered 2020-01-09: 3.375 g via INTRAVENOUS
  Filled 2020-01-09: qty 50

## 2020-01-09 MED ORDER — HEPARIN SODIUM (PORCINE) 1000 UNIT/ML IJ SOLN
9000.0000 [IU] | Freq: Once | INTRAMUSCULAR | Status: AC
  Administered 2020-01-10: 9000 [IU] via INTRAVENOUS
  Filled 2020-01-09 (×2): qty 9

## 2020-01-09 MED ORDER — SODIUM CHLORIDE 0.45 % IV SOLN: INTRAVENOUS | Status: DC

## 2020-01-09 SURGICAL SUPPLY — 87 items
BLADE CLIPPER SURG (BLADE) ×2 IMPLANT
BLADE SAW STERNAL (BLADE) ×2 IMPLANT
BLADE STERNUM SYSTEM 6 (BLADE) ×2 IMPLANT
BLADE SURG 10 STRL SS (BLADE) IMPLANT
BLADE SURG 11 STRL SS (BLADE) ×2 IMPLANT
CLIP VESOCCLUDE MED 24/CT (CLIP) IMPLANT
CLIP VESOCCLUDE SM WIDE 24/CT (CLIP) IMPLANT
CNTNR URN SCR LID CUP LEK RST (MISCELLANEOUS) IMPLANT
CONT SPEC 4OZ STRL OR WHT (MISCELLANEOUS)
COVER BACK TABLE 60X90IN (DRAPES) IMPLANT
COVER MAYO STAND STRL (DRAPES) ×2 IMPLANT
COVER SURGICAL LIGHT HANDLE (MISCELLANEOUS) ×2 IMPLANT
COVER WAND RF STERILE (DRAPES) IMPLANT
DRAPE HALF SHEET 40X57 (DRAPES) ×4 IMPLANT
DRAPE INCISE IOBAN 66X45 STRL (DRAPES) ×4 IMPLANT
DRAPE SLUSH MACHINE 52X66 (DRAPES) ×2 IMPLANT
DRSG COVADERM 4X10 (GAUZE/BANDAGES/DRESSINGS) IMPLANT
DRSG TELFA 3X8 NADH (GAUZE/BANDAGES/DRESSINGS) IMPLANT
DURAPREP 26ML APPLICATOR (WOUND CARE) ×4 IMPLANT
ELECT BLADE 6.5 EXT (BLADE) IMPLANT
ELECT REM PT RETURN 9FT ADLT (ELECTROSURGICAL) ×4
ELECTRODE REM PT RTRN 9FT ADLT (ELECTROSURGICAL) ×2 IMPLANT
GAUZE 4X4 16PLY RFD (DISPOSABLE) IMPLANT
GLOVE BIO SURGEON STRL SZ7 (GLOVE) IMPLANT
GLOVE BIO SURGEON STRL SZ7.5 (GLOVE) IMPLANT
GLOVE BIO SURGEON STRL SZ8 (GLOVE) IMPLANT
GLOVE BIO SURGEON STRL SZ8.5 (GLOVE) IMPLANT
GLOVE BIOGEL PI IND STRL 7.0 (GLOVE) IMPLANT
GLOVE BIOGEL PI IND STRL 7.5 (GLOVE) IMPLANT
GLOVE BIOGEL PI IND STRL 8 (GLOVE) IMPLANT
GLOVE BIOGEL PI IND STRL 8.5 (GLOVE) IMPLANT
GLOVE BIOGEL PI INDICATOR 7.0 (GLOVE)
GLOVE BIOGEL PI INDICATOR 7.5 (GLOVE)
GLOVE BIOGEL PI INDICATOR 8 (GLOVE)
GLOVE BIOGEL PI INDICATOR 8.5 (GLOVE)
GLOVE SURG SS PI 7.0 STRL IVOR (GLOVE) IMPLANT
GLOVE SURG SS PI 7.5 STRL IVOR (GLOVE) IMPLANT
GLOVE SURG SS PI 8.0 STRL IVOR (GLOVE) IMPLANT
GOWN STRL REUS W/ TWL LRG LVL3 (GOWN DISPOSABLE) ×3 IMPLANT
GOWN STRL REUS W/ TWL XL LVL3 (GOWN DISPOSABLE) ×2 IMPLANT
GOWN STRL REUS W/TWL LRG LVL3 (GOWN DISPOSABLE) ×6
GOWN STRL REUS W/TWL XL LVL3 (GOWN DISPOSABLE) ×4
HANDLE SUCTION POOLE (INSTRUMENTS) IMPLANT
KIT POST MORTEM ADULT 36X90 (BAG) ×2 IMPLANT
KIT TURNOVER KIT B (KITS) ×2 IMPLANT
LOOP VESSEL MAXI BLUE (MISCELLANEOUS) ×2 IMPLANT
LOOP VESSEL MINI RED (MISCELLANEOUS) ×2 IMPLANT
MANIFOLD NEPTUNE II (INSTRUMENTS) ×2 IMPLANT
NEEDLE BIOPSY 14X6 SOFT TISS (NEEDLE) IMPLANT
NS IRRIG 1000ML POUR BTL (IV SOLUTION) IMPLANT
PACK AORTA (CUSTOM PROCEDURE TRAY) ×2 IMPLANT
PACK UNIVERSAL I (CUSTOM PROCEDURE TRAY) ×2 IMPLANT
PAD ARMBOARD 7.5X6 YLW CONV (MISCELLANEOUS) ×2 IMPLANT
PENCIL BUTTON HOLSTER BLD 10FT (ELECTRODE) ×2 IMPLANT
SOL PREP POV-IOD 4OZ 10% (MISCELLANEOUS) IMPLANT
SPONGE INTESTINAL PEANUT (DISPOSABLE) IMPLANT
SPONGE LAP 18X18 RF (DISPOSABLE) ×4 IMPLANT
STAPLER VISISTAT 35W (STAPLE) ×2 IMPLANT
SUCTION POOLE HANDLE (INSTRUMENTS)
SUT BONE WAX W31G (SUTURE) IMPLANT
SUT ETHIBOND 5 LR DA (SUTURE) IMPLANT
SUT ETHILON 1 LR 30 (SUTURE) ×8 IMPLANT
SUT ETHILON 2 LR (SUTURE) IMPLANT
SUT PROLENE 3 0 RB 1 (SUTURE) IMPLANT
SUT PROLENE 3 0 SH 1 (SUTURE) IMPLANT
SUT PROLENE 4 0 RB 1 (SUTURE) ×2
SUT PROLENE 4-0 RB1 .5 CRCL 36 (SUTURE) ×1 IMPLANT
SUT PROLENE 5 0 C 1 24 (SUTURE) IMPLANT
SUT PROLENE 6 0 BV (SUTURE) IMPLANT
SUT SILK 0 TIES 10X30 (SUTURE) IMPLANT
SUT SILK 1 SH (SUTURE) IMPLANT
SUT SILK 1 TIES 10X30 (SUTURE) IMPLANT
SUT SILK 2 0 (SUTURE)
SUT SILK 2 0 SH (SUTURE) ×4 IMPLANT
SUT SILK 2 0 SH CR/8 (SUTURE) IMPLANT
SUT SILK 2 0 TIES 10X30 (SUTURE) IMPLANT
SUT SILK 2-0 18XBRD TIE 12 (SUTURE) IMPLANT
SUT SILK 3 0 SH CR/8 (SUTURE) IMPLANT
SUT SILK 3 0 TIES 10X30 (SUTURE) IMPLANT
SWAB COLLECTION DEVICE MRSA (MISCELLANEOUS) IMPLANT
SWAB CULTURE ESWAB REG 1ML (MISCELLANEOUS) IMPLANT
SYR 50ML LL SCALE MARK (SYRINGE) IMPLANT
SYRINGE TOOMEY DISP (SYRINGE) IMPLANT
TAPE UMBILICAL 1/8 X36 TWILL (MISCELLANEOUS) IMPLANT
TUBE CONNECTING 12X1/4 (SUCTIONS) ×2 IMPLANT
WATER STERILE IRR 1000ML POUR (IV SOLUTION) IMPLANT
YANKAUER SUCT BULB TIP NO VENT (SUCTIONS) ×2 IMPLANT

## 2020-01-09 NOTE — Progress Notes (Signed)
RT NOTE:  Recruitment maneuver performedX 2 minutesat this time, no complications noted. PC 30,PEEP 10,RR 10,FIO2 100%,I time 3.0 seconds

## 2020-01-09 NOTE — Progress Notes (Signed)
RT NOTE:  Recruitment maneuver performed X 2 minutes at this time, no complications noted. PC 25, PEEP 5, RR 10, FIO2 100%, I time 3.0 seconds. 

## 2020-01-09 NOTE — Progress Notes (Signed)
Recruitment procedure done.Marland Kitchen PC25 PEEP 5 RR 10 100% I time 3.0 sec.

## 2020-01-09 NOTE — Progress Notes (Signed)
eLink Physician-Brief Progress Note Patient Name: Devin Gray DOB: 15-Oct-1956 MRN: [id number]   Date of Service  01/02/2020  HPI/Events of Note  Patient awaiting organ donation.  Berton Bon is requesting Heparin 100 units/ Kg  order to be given in the OR after patient is extubated, per the CDS organ harvest protocol. I spoke with Verlon Au to confirm the order.  eICU Interventions  Heparin 100 units/ kg ordered.        Thomasene Lot Jennings Corado 01/02/2020, 10:05 PM

## 2020-01-10 MED ORDER — 0.9 % SODIUM CHLORIDE (POUR BTL) OPTIME
TOPICAL | Status: DC | PRN
  Administered 2020-01-10: 6000 mL

## 2020-01-10 NOTE — Progress Notes (Signed)
Sheilah Pigeon, RN and Lyndal Pulley, RN listened for absence of heart tones for one full minute at 0104. None auscultated.  Time of death was declared at 57.

## 2020-01-10 NOTE — Progress Notes (Signed)
90 mL of Morphine wasted with Manley Mason in sink.

## 2020-01-10 NOTE — Progress Notes (Signed)
RT NOTE:  Pt transported to OR on vent. Pt extubated @ 0040 per order by CDS end of life order set.

## 2020-01-12 ENCOUNTER — Encounter (HOSPITAL_COMMUNITY): Payer: Self-pay | Admitting: Anesthesiology

## 2020-01-12 LAB — SURGICAL PATHOLOGY

## 2020-01-13 LAB — CULTURE, BLOOD (ROUTINE X 2)
Culture: NO GROWTH
Culture: NO GROWTH
Special Requests: ADEQUATE
Special Requests: ADEQUATE

## 2020-01-15 NOTE — Death Summary Note (Signed)
DEATH SUMMARY   Patient Details  Name: Devin Gray MRN: [id number] DOB: 30-Mar-1956  Admission/Discharge Information   Admit Date:  01-16-20  Date of Death: Date of Death: Jan 22, 2020  Time of Death: Time of Death: 0109  Length of Stay: 6  Referring Physician: No primary care provider on file.   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 year old male who has a known history of drug abuse.  He was found unresponsive and pulseless.  Spontaneous circulation was restored after 25 minutes of CPR. After period of therapeutic temperature management, patient made no meaningful neurological recovery with diffuse anoxic changes on MRI and essentially isoelectric EEG off of all sedation. The situation was discussed with the family and the patient was transitioned to comfort care.  He was evaluated and was accepted as an an organ donor after cardiac death.   Pertinent Labs and Studies  Significant Diagnostic Studies CT ABDOMEN PELVIS WO CONTRAST  Result Date: 01/08/2020 CLINICAL DATA:  Respiratory failure.  Renal failure. EXAM: CT CHEST, ABDOMEN AND PELVIS WITHOUT CONTRAST TECHNIQUE: Multidetector CT imaging of the chest, abdomen and pelvis was performed following the standard protocol without IV contrast. COMPARISON:  None. FINDINGS: CT CHEST FINDINGS Cardiovascular: Left subclavian central venous catheter has tip in the SVC. Heart is normal size. Thoracic aorta is normal in caliber. Remaining vascular structures are unremarkable. Mediastinum/Nodes: No significant mediastinal or hilar adenopathy. Remaining mediastinal structures are  unremarkable. Lungs/Pleura: Endotracheal tube is in adequate position. Lungs are adequately inflated with mild posterior bibasilar opacification which may be due to atelectasis or infection. There is a small right-sided pleural effusion and possible tiny amount left pleural fluid. There is a somewhat diffuse bilateral micronodular/tree in bud pattern of opacification most prominent over the lower lobes. These findings likely due to infection which may be bacterial related aspiration versus atypical infection or inflammatory process. Musculoskeletal: Degenerative change of the spine with minimal loss of height of several mid to lower thoracic vertebral bodies likely chronic. CT ABDOMEN PELVIS FINDINGS Hepatobiliary: Liver and biliary tree are normal. There is hazy appearance to the gallbladder wall with mild adjacent pericholecystic fluid present. Tiny amount of perihepatic fluid. Pancreas: Normal. Spleen: Normal. Adrenals/Urinary Tract: Adrenal glands are normal. Kidneys are normal size without hydronephrosis or nephrolithiasis. Foley catheter is present within a decompressed bladder. Ureters are unremarkable. Stomach/Bowel: Nasogastric tube has tip over the anterior aspect of the mid body of the stomach. Small bowel is unremarkable. Appendix is not well visualized. Colon is unremarkable. Rectal tube is present. Vascular/Lymphatic: Mild calcified plaque over the abdominal aorta which is normal in caliber. No significant adenopathy. Reproductive: Prostate gland is normal. Other: Small right inguinal hernia containing a very short segment of small bowel as well as fluid. Fluid extends into the right scrotum. Mild diffuse subcutaneous edema over the flanks over the lower abdomen/pelvis. Minimal free fluid over the right pericolic gutter. Musculoskeletal: Mild degenerative change of the spine and hips. IMPRESSION: 1. Bilateral nodular/tree-in-bud pattern of opacification along with mild bibasilar atelectasis and tiny  amount of bilateral pleural fluid right worse than left. Findings may be due to aspiration pneumonia versus other atypical infection or inflammatory process. Recommend follow-up CT  4-6 weeks. 2. Ill-defined gallbladder wall with evidence of pericholecystic fluid and small amount of free fluid in the right abdomen. Recommend clinical correlation for possible acute cholecystitis as right upper quadrant ultrasound may be helpful for further evaluation. 3. Small right inguinal hernia containing a short segment of small bowel as well as moderate fluid which extends into the right scrotum. 4.  Tubes and lines as described. 5.  Aortic Atherosclerosis (ICD10-I70.0). Electronically Signed   By: Elberta Fortis M.D.   On: 01/08/2020 13:57   DG Abd 1 View  Result Date: 12/23/2019 CLINICAL DATA:  Orogastric tube placement. EXAM: ABDOMEN - 1 VIEW COMPARISON:  None. FINDINGS: The bowel gas pattern is normal. Distal tip of enteric tube is seen in expected position of distal stomach. No radio-opaque calculi or other significant radiographic abnormality are seen. IMPRESSION: Distal tip of enteric tube seen in expected position of distal stomach. Electronically Signed   By: Lupita Raider M.D.   On: 01/14/2020 12:48   CT Head Wo Contrast  Result Date: 01/06/2020 CLINICAL DATA:  Cardiac arrest. Additional history provided: Patient found unresponsive by friends EXAM: CT HEAD WITHOUT CONTRAST TECHNIQUE: Contiguous axial images were obtained from the base of the skull through the vertex without intravenous contrast. COMPARISON:  Head CT 02/23/2018 FINDINGS: Brain: The examination is mildly motion degraded. There is no evidence of acute intracranial hemorrhage, intracranial mass, midline shift or extra-axial fluid collection.Redemonstrated small focus of chronic encephalomalacia within the anterior left frontal lobe Vascular: No hyperdense vessel. Skull: Normal. Negative for fracture or focal lesion. Sinuses/Orbits: Severe  pansinusitis with air-fluid levels. Visualized orbits demonstrate no acute abnormality. Prominent secretions within the nasal passages and nasopharynx. IMPRESSION: 1. Motion degraded exam. 2. No evidence of acute intracranial abnormality. 3. Redemonstrated small region of chronic left frontal lobe encephalomalacia. 4. Severe pansinusitis with air-fluid levels. 5. Prominent secretions within the nasal passages and nasopharynx. Electronically Signed   By: Jackey Loge DO   On: 12/28/2019 07:52   CT CHEST WO CONTRAST  Result Date: 01/08/2020 CLINICAL DATA:  Respiratory failure.  Renal failure. EXAM: CT CHEST, ABDOMEN AND PELVIS WITHOUT CONTRAST TECHNIQUE: Multidetector CT imaging of the chest, abdomen and pelvis was performed following the standard protocol without IV contrast. COMPARISON:  None. FINDINGS: CT CHEST FINDINGS Cardiovascular: Left subclavian central venous catheter has tip in the SVC. Heart is normal size. Thoracic aorta is normal in caliber. Remaining vascular structures are unremarkable. Mediastinum/Nodes: No significant mediastinal or hilar adenopathy. Remaining mediastinal structures are unremarkable. Lungs/Pleura: Endotracheal tube is in adequate position. Lungs are adequately inflated with mild posterior bibasilar opacification which may be due to atelectasis or infection. There is a small right-sided pleural effusion and possible tiny amount left pleural fluid. There is a somewhat diffuse bilateral micronodular/tree in bud pattern of opacification most prominent over the lower lobes. These findings likely due to infection which may be bacterial related aspiration versus atypical infection or inflammatory process. Musculoskeletal: Degenerative change of the spine with minimal loss of height of several mid to lower thoracic vertebral bodies likely chronic. CT ABDOMEN PELVIS FINDINGS Hepatobiliary: Liver and biliary tree are normal. There is hazy appearance to the gallbladder wall with mild  adjacent pericholecystic fluid present. Tiny amount of perihepatic fluid. Pancreas: Normal. Spleen: Normal. Adrenals/Urinary Tract: Adrenal glands are normal. Kidneys are normal size without hydronephrosis or nephrolithiasis. Foley catheter is present within a decompressed bladder. Ureters are unremarkable. Stomach/Bowel: Nasogastric tube has tip over the anterior aspect of the mid body of the  stomach. Small bowel is unremarkable. Appendix is not well visualized. Colon is unremarkable. Rectal tube is present. Vascular/Lymphatic: Mild calcified plaque over the abdominal aorta which is normal in caliber. No significant adenopathy. Reproductive: Prostate gland is normal. Other: Small right inguinal hernia containing a very short segment of small bowel as well as fluid. Fluid extends into the right scrotum. Mild diffuse subcutaneous edema over the flanks over the lower abdomen/pelvis. Minimal free fluid over the right pericolic gutter. Musculoskeletal: Mild degenerative change of the spine and hips. IMPRESSION: 1. Bilateral nodular/tree-in-bud pattern of opacification along with mild bibasilar atelectasis and tiny amount of bilateral pleural fluid right worse than left. Findings may be due to aspiration pneumonia versus other atypical infection or inflammatory process. Recommend follow-up CT 4-6 weeks. 2. Ill-defined gallbladder wall with evidence of pericholecystic fluid and small amount of free fluid in the right abdomen. Recommend clinical correlation for possible acute cholecystitis as right upper quadrant ultrasound may be helpful for further evaluation. 3. Small right inguinal hernia containing a short segment of small bowel as well as moderate fluid which extends into the right scrotum. 4.  Tubes and lines as described. 5.  Aortic Atherosclerosis (ICD10-I70.0). Electronically Signed   By: Elberta Fortisaniel  Boyle M.D.   On: 01/08/2020 13:57   MR BRAIN WO CONTRAST  Result Date: 01/07/2020 CLINICAL DATA:  Encephalopathy.  Additional history provided: Patient post CPR, found pulseless and apneic by family members, original rhythm of asystole, received 15 minutes of CPR with possible down time of up to 2 hours, received 3 rounds of epinephrine. EXAM: MRI HEAD WITHOUT CONTRAST TECHNIQUE: Multiplanar, multiecho pulse sequences of the brain and surrounding structures were obtained without intravenous contrast. COMPARISON:  Head CT 24-Aug-2020 FINDINGS: Brain: There is abnormal diffusion-weighted and T2/FLAIR hyperintense signal abnormality within the lentiform nuclei, medial thalami, medial temporal lobes/hippocampi, periaqueductal gray matter, and within the corticospinal tracts bilaterally. Similar signal abnormality is present within the bilateral cerebral cortex (greatest in the perirolandic regions) and within the cerebellar vermis. There is also more generalized diffusion weighted hyperintensity within the bilateral cerebellar hemispheres, most notable inferiorly (for instance as seen on series 3, image 9). Redemonstrated small foci of chronic encephalomalacia within the anterior left frontal lobe and anteroinferior right frontal lobe. No evidence of intracranial mass. No midline shift or extra-axial fluid collection. No chronic intracranial blood products. Cerebral volume is normal for age. Vascular: Flow voids maintained within the proximal large arterial vessels. Skull and upper cervical spine: No focal marrow lesion. Sinuses/Orbits: Visualized orbits demonstrate no acute abnormality. Pansinusitis bilateral mastoid effusions. IMPRESSION: 1. Findings consistent with hypoxic/ischemic injury to the supratentorial and infratentorial brain as described. 2. Small foci of chronic encephalomalacia within the anterior left frontal lobe and anteroinferior right frontal lobe, possibly posttraumatic. 3. Pansinusitis with bilateral mastoid effusions. Electronically Signed   By: Jackey LogeKyle  Golden DO   On: 01/07/2020 12:44   DG CHEST PORT 1  VIEW  Result Date: 01/07/2020 CLINICAL DATA:  Respiratory failure, abnormal chest CT EXAM: PORTABLE CHEST 1 VIEW COMPARISON:  01/08/2020 FINDINGS: Single frontal view of the chest demonstrates endotracheal tube, enteric catheter, and left subclavian central venous catheter unchanged. The bilateral tree in bud airspace disease seen on prior CT is again seen on this exam. No new consolidation. The small pleural effusion seen on CT are not visible by chest x-ray. No pneumothorax. IMPRESSION: 1. Continued tree-in-bud ground-glass airspace disease throughout the lungs, greatest at the bases. Differential includes infection or aspiration. Electronically Signed   By: Casimiro NeedleMichael  Brown Manson PasseyD.   On: 01/03/2020 14:48   DG CHEST PORT 1 VIEW  Result Date: 01/05/2020 CLINICAL DATA:  64 year old male with history of cardiac arrest. EXAM: PORTABLE CHEST 1 VIEW COMPARISON:  Chest x-ray 01/29/20. FINDINGS: An endotracheal tube is in place with tip 3.3 cm above the carina. A nasogastric tube is seen extending into the stomach, however, the tip of the nasogastric tube extends below the lower margin of the image. There is a left-sided subclavian central venous catheter with tip terminating in the distal superior vena cava. Transcutaneous defibrillator pad projecting over the lower thorax. Lung volumes are low. Ill-defined areas of interstitial prominence and vague opacities are noted in the perihilar aspects of both lungs. No pleural effusions. No evidence of pulmonary edema. No pneumothorax. Heart size is normal. Upper mediastinal contours are within normal limits. IMPRESSION: 1. Support apparatus, as above. 2. Vague perihilar opacities and areas of interstitial prominence in the lungs bilaterally. This is nonspecific, but could suggest sequela of aspiration. Electronically Signed   By: Trudie Reed M.D.   On: 01/05/2020 08:06   DG Chest Portable 1 View  Result Date: 01-29-20 CLINICAL DATA:  Verify placement of central  line. EXAM: PORTABLE CHEST 1 VIEW COMPARISON:  Chest radiograph performed earlier the same day 01-29-2020 FINDINGS: Interval placement of a left-sided approach central venous catheter with tip projecting in the region of the caudal SVC. ET tube terminates just below the level of the clavicular heads. Enteric tube passes below the level left hemidiaphragm with tip excluded from the field of view. Heart size within normal limits. Aortic atherosclerosis. Improved aeration of the left lung base. No evidence of airspace consolidation within the lungs. No evidence of pleural effusion or pneumothorax. No acute bony abnormality is identified. IMPRESSION: Interval placement of a left-sided approach central venous catheter with tip projecting in the region of the caudal SVC. Additional support apparatus as described. Improved aeration of the left lung base. No evidence of airspace consolidation within the lungs. Electronically Signed   By: Jackey Loge DO   On: 01-29-20 07:06   DG Chest Portable 1 View  Result Date: 2020-01-29 CLINICAL DATA:  Patient found unresponsive. Post CPR. Intubation. EXAM: PORTABLE CHEST 1 VIEW COMPARISON:  November 18, 2019 FINDINGS: A transcutaneous pacer lead overlies the left side of the mediastinum and medial left lung. An ETT is in good position. An NG tube terminates below today's film. No pneumothorax identified. Left retrocardiac opacity with obscuration left hemidiaphragm is identified. The lungs are otherwise clear. No nodules or masses. The heart, hila, and mediastinum are unremarkable. No obvious rib fractures identified. IMPRESSION: 1. Support apparatus as above. 2. Left retrocardiac opacity could represent atelectasis, infiltrate, or aspiration. Recommend follow-up to resolution. Electronically Signed   By: Gerome Sam III M.D   On: 29-Jan-2020 05:44   EEG adult  Result Date: 2020-01-29 Thana Farr, MD     29-Jan-2020  1:01 PM ELECTROENCEPHALOGRAM REPORT Patient: DAMACIO WEISGERBER       Room #: [facility] EEG No. ID: [id number] Age: 64 y.o.        Sex: male Requesting Physician: Ollen Bowl Report Date:  01/29/2020       Interpreting Physician: Thana Farr History: BILLYJOE GO is an 64 y.o. male found down Medications: Nimbex, Versed, Fentanyl, Diprovan, Unasyn Conditions of Recording:  This is a 21 channel routine scalp EEG performed with bipolar and monopolar montages arranged in accordance to the international 10/20 system of electrode placement. One channel was  dedicated to EKG recording. The patient is in the intubated, sedated and paralyzed state. Description:  The background activity is discontinuous. It is markedly attenuated for the majority of the recording diffusely.  There are generalized, high voltage bursts of polyspike, spike and slow wave activity lasting 1-2 seconds that occur intermittently.  For the most part these bursts occur every 2-3 minutes but on rare occasions can be as frequent as every 6 seconds.  There is no change in clinical activity with these bursts.  This discontinuous activity is maintained throughout the tracing. Hyperventilation and intermittent photic stimulation were not performed. IMPRESSION: This is an abnormal EEG due to a burst-suppression pattern seen throughout the tracing.  Burst suppression pattern can be seen in a variety of circumstances, including anesthesia, drug intoxication, hypothermia, as well as cerebral anoxia.  Clinical/neurological, and radiographic correlation advised.  Thana Farr, MD Neurology [phone] Jan 12, 2020, 12:50 PM   Overnight EEG with video  Result Date: 01/05/2020 Charlsie Quest, MD     01/06/2020  8:43 AM Patient Name: JERMON CHALFANT MRN: [id number] Epilepsy Attending: Charlsie Quest Referring Physician/Provider: Dr Kalman Shan Duration: 01-12-2020 1148 to 01/05/2020 1148 Patient history: 63yo M s.p cardiac arrest on TTM. EEG to evaluate for seizure. Level of alertness: comatose AEDs during EEG study:  versed Technical aspects: This EEG study was done with scalp electrodes positioned according to the 10-20 International system of electrode placement. Electrical activity was acquired at a sampling rate of 500Hz  and reviewed with a high frequency filter of 70Hz  and a low frequency filter of 1Hz . EEG data were recorded continuously and digitally stored. DESCRIPTION: EEG showed continuous generalized background suppression lasting 15 to 20 seconds with intermittent bursts of generalized high amplitude polymorphic 3 to 6 Hz theta-delta slowing lasting 1 to 5 seconds.  Reactivity was not tested during the study hyperventilation and photic stimulation were not performed. Abnormality -Burst suppression, generalized IMPRESSION: This study is suggestive of profound diffuse encephalopathy, nonspecific to etiology. No seizures or definite epileptiform discharges were seen throughout the recording.   ECHOCARDIOGRAM COMPLETE  Result Date: 2020-01-12    ECHOCARDIOGRAM REPORT   Patient Name:   DEMARYIUS IMRAN Date of Exam: 12-Jan-2020 Medical Rec #:  01/06/2020      Height:       69.0 in Accession #:    Gwenith Daily     Weight:       190.0 lb Date of Birth:  [birth year]     BSA:          2.021 m Patient Age:    63 years       BP:           121/70 mmHg Patient Gender: M              HR:           76 bpm. Exam Location:  Inpatient Procedure: 2D Echo, Color Doppler and Cardiac Doppler Indications:    Cardiac arrest i46.9  History:        Patient has no prior history of Echocardiogram examinations.  Sonographer:    [id number] Senior RDCS Referring Phys: [phone] MURALI RAMASWAMY  Sonographer Comments: Echo performed with patient supine and on artificial respirator and no parasternal window. IMPRESSIONS  1. Left ventricular ejection fraction, by estimation, is 60 to 65%. The left ventricle has normal function. The left ventricle has no regional wall motion abnormalities. Left ventricular diastolic parameters are indeterminate.   2. Right ventricular systolic function  is normal. The right ventricular size is normal.  3. The mitral valve is normal in structure. No evidence of mitral valve regurgitation. No evidence of mitral stenosis.  4. The aortic valve is normal in structure. Aortic valve regurgitation is not visualized. No aortic stenosis is present.  5. The inferior vena cava is normal in size with greater than 50% respiratory variability, suggesting right atrial pressure of 3 mmHg. FINDINGS  Left Ventricle: Left ventricular ejection fraction, by estimation, is 60 to 65%. The left ventricle has normal function. The left ventricle has no regional wall motion abnormalities. The left ventricular internal cavity size was normal in size. There is  no left ventricular hypertrophy. Left ventricular diastolic parameters are indeterminate. Right Ventricle: The right ventricular size is normal. No increase in right ventricular wall thickness. Right ventricular systolic function is normal. Left Atrium: Left atrial size was normal in size. Right Atrium: Right atrial size was normal in size. Pericardium: There is no evidence of pericardial effusion. Mitral Valve: The mitral valve is normal in structure. Normal mobility of the mitral valve leaflets. No evidence of mitral valve regurgitation. No evidence of mitral valve stenosis. Tricuspid Valve: The tricuspid valve is normal in structure. Tricuspid valve regurgitation is not demonstrated. No evidence of tricuspid stenosis. Aortic Valve: The aortic valve is normal in structure. Aortic valve regurgitation is not visualized. No aortic stenosis is present. Pulmonic Valve: The pulmonic valve was normal in structure. Pulmonic valve regurgitation is not visualized. No evidence of pulmonic stenosis. Aorta: The aortic root is normal in size and structure. Venous: The inferior vena cava is normal in size with greater than 50% respiratory variability, suggesting right atrial pressure of 3 mmHg. IAS/Shunts: No  atrial level shunt detected by color flow Doppler.  LEFT VENTRICLE PLAX 2D LVIDd:         3.20 cm  Diastology LVIDs:         2.20 cm  LV e' lateral:   9.25 cm/s LV PW:         0.90 cm  LV E/e' lateral: 5.6 LV IVS:        1.00 cm  LV e' medial:    7.51 cm/s LVOT diam:     2.00 cm  LV E/e' medial:  6.8 LV SV:         42 LV SV Index:   21 LVOT Area:     3.14 cm  RIGHT VENTRICLE RV S prime:     12.20 cm/s TAPSE (M-mode): 1.8 cm LEFT ATRIUM             Index       RIGHT ATRIUM          Index LA diam:        2.80 cm 1.39 cm/m  RA Area:     9.53 cm LA Vol (A2C):   57.2 ml 28.30 ml/m RA Volume:   15.90 ml 7.87 ml/m LA Vol (A4C):   22.0 ml 10.88 ml/m LA Biplane Vol: 38.2 ml 18.90 ml/m  AORTIC VALVE LVOT Vmax:   84.40 cm/s LVOT Vmean:  55.700 cm/s LVOT VTI:    0.134 m  AORTA Ao Root diam: 2.40 cm MITRAL VALVE MV Area (PHT): 3.48 cm    SHUNTS MV Decel Time: 218 msec    Systemic VTI:  0.13 m MV E velocity: 51.40 cm/s  Systemic Diam: 2.00 cm MV A velocity: 38.60 cm/s MV E/A ratio:  1.33 Donato Schultz MD Electronically signed by Donato Schultz MD Signature  Date/Time: 12/20/2019/3:31:32 PM    Final     Microbiology Recent Results (from the past 240 hour(s))  Culture, respiratory (non-expectorated)     Status: None   Collection Time: 01/06/20  7:10 PM   Specimen: Tracheal Aspirate; Respiratory  Result Value Ref Range Status   Specimen Description TRACHEAL ASPIRATE  Final   Special Requests NONE  Final   Gram Stain   Final    NO RBC SEEN ABUNDANT YEAST MODERATE GRAM NEGATIVE RODS Performed at [facility], [address], [place], [place] [address]    Culture   Final    FEW ENTEROBACTER CLOACAE MODERATE CANDIDA ALBICANS    Report Status 01/08/2020 FINAL  Final   Organism ID, Bacteria ENTEROBACTER CLOACAE  Final      Susceptibility   Enterobacter cloacae - MIC*    CEFAZOLIN >=64 RESISTANT Resistant     CEFEPIME <=0.12 SENSITIVE Sensitive     CEFTAZIDIME <=1 SENSITIVE Sensitive     CIPROFLOXACIN  <=0.25 SENSITIVE Sensitive     GENTAMICIN <=1 SENSITIVE Sensitive     IMIPENEM 1 SENSITIVE Sensitive     TRIMETH/SULFA <=20 SENSITIVE Sensitive     PIP/TAZO <=4 SENSITIVE Sensitive     * FEW ENTEROBACTER CLOACAE  Culture, blood (routine x 2)     Status: None   Collection Time: 01/08/20  1:49 PM   Specimen: BLOOD RIGHT HAND  Result Value Ref Range Status   Specimen Description BLOOD RIGHT HAND  Final   Special Requests   Final    BOTTLES DRAWN AEROBIC AND ANAEROBIC Blood Culture adequate volume   Culture   Final    NO GROWTH 5 DAYS Performed at [facility], [place] [facility] [address] [place], [place] [address]    Report Status 01/13/2020 FINAL  Final  Culture, blood (routine x 2)     Status: None   Collection Time: 01/08/20  1:52 PM   Specimen: BLOOD  Result Value Ref Range Status   Specimen Description BLOOD RIGHT ANTECUBITAL  Final   Special Requests   Final    BOTTLES DRAWN AEROBIC AND ANAEROBIC Blood Culture adequate volume   Culture   Final    NO GROWTH 5 DAYS Performed at [facility], [place] [address], [place], [place] [address]    Report Status 01/13/2020 FINAL  Final  Culture, Urine     Status: None   Collection Time: 01/08/20  4:24 PM   Specimen: Urine, Random  Result Value Ref Range Status   Specimen Description URINE, RANDOM  Final   Special Requests NONE  Final   Culture   Final    NO GROWTH Performed at [facility], [place] [address] [place], [place] [address]    Report Status 12/22/2019 FINAL  Final  SARS CORONAVIRUS 2 (TAT 6-24 HRS) Nasopharyngeal Nasopharyngeal Swab     Status: None   Collection Time: 01/08/20  4:24 PM   Specimen: Nasopharyngeal Swab  Result Value Ref Range Status   SARS Coronavirus 2 NEGATIVE NEGATIVE Final    Comment: (NOTE) SARS-CoV-2 target nucleic acids are NOT DETECTED. The SARS-CoV-2 RNA is generally detectable in upper and lower respiratory specimens during the acute phase of infection. Negative results do not preclude  SARS-CoV-2 infection, do not rule out co-infections with other pathogens, and should not be used as the sole basis for treatment or other patient management decisions. Negative results must be combined with clinical observations, patient history, and epidemiological information. The expected result is  Negative. Fact Sheet for Patients: HairSlick.no Fact Sheet for Healthcare Providers: quierodirigir.com This test is not yet approved or cleared by the [place] FDA and  has been authorized for detection and/or diagnosis of SARS-CoV-2 by FDA under an Emergency Use Authorization (EUA). This EUA will remain  in effect (meaning this test can be used) for the duration of the COVID-19 declaration under Section 56 4(b)(1) of the Act, 21 U.S.C. section 360bbb-3(b)(1), unless the authorization is terminated or revoked sooner. Performed at [facility], [address], [place], [place] [address]     Lab Basic Metabolic Panel: Recent Labs  Lab 01/08/20 0405 01/08/20 0405 01/08/20 1240 01/08/20 1240 01/08/20 1540 01/08/20 2207 12/25/2019 0435 12/24/2019 1135 12/31/2019 1551  NA 147*   < > 150*   < > 151* 151* 152* 153* 154*  K 3.2*   < > 3.8   < > 3.6 3.5 3.5 3.6 3.5  CL 116*  --  118*  --   --  121* 119*  --  119*  CO2 24  --  25  --   --  25 24  --  24  GLUCOSE 125*  --  118*  --   --  111* 130*  --  134*  BUN 46*  --  39*  --   --  37* 38*  --  38*  CREATININE 1.99*  --  1.78*  --   --  1.73* 1.92*  --  1.67*  CALCIUM 8.2*  --  8.8*  --   --  8.6* 8.8*  --  8.9   < > = values in this interval not displayed.   Liver Function Tests: Recent Labs  Lab 01/08/20 0405 01/08/20 1240 01/08/20 2207 01/06/2020 0435  AST 68* 57* 53* 49*  ALT 49* 46* 39 37  ALKPHOS 52 57 46 50  BILITOT 0.5 0.5 0.6 0.6  PROT 4.7* 5.2* 5.0* 5.2*  ALBUMIN 1.9* 2.1* 2.0* 2.1*   No results for input(s): LIPASE, AMYLASE in the last 168 hours. No  results for input(s): AMMONIA in the last 168 hours. CBC: Recent Labs  Lab 01/08/20 0405 01/08/20 1240 01/08/20 1540 12/19/2019 1135  WBC 7.1 9.3  --   --   NEUTROABS 5.4 7.4  --   --   HGB 11.8* 12.9* 11.2* 11.9*  HCT 37.6* 40.4 33.0* 35.0*  MCV 95.7 94.0  --   --   PLT 57* 67*  --   --    Cardiac Enzymes: No results for input(s): CKTOTAL, CKMB, CKMBINDEX, TROPONINI in the last 168 hours. Sepsis Labs: Recent Labs  Lab 01/08/20 0405 01/08/20 1240  WBC 7.1 9.3    Procedures/Operations  Mechanical ventilation, EEG, MRI, organ procurement.   Athziry Millican 01/14/2020, 6:03 PM

## 2020-01-15 DEATH — deceased

## 2020-05-20 ENCOUNTER — Encounter (HOSPITAL_COMMUNITY): Payer: Self-pay

## 2020-10-27 IMAGING — DX DG CHEST 1V PORT
1 series · 1 of 1 positions shown · non-contrast
Comparison: 01/08/2020

CLINICAL DATA: Respiratory failure, abnormal chest CT

EXAM:
PORTABLE CHEST 1 VIEW

[chest ap]
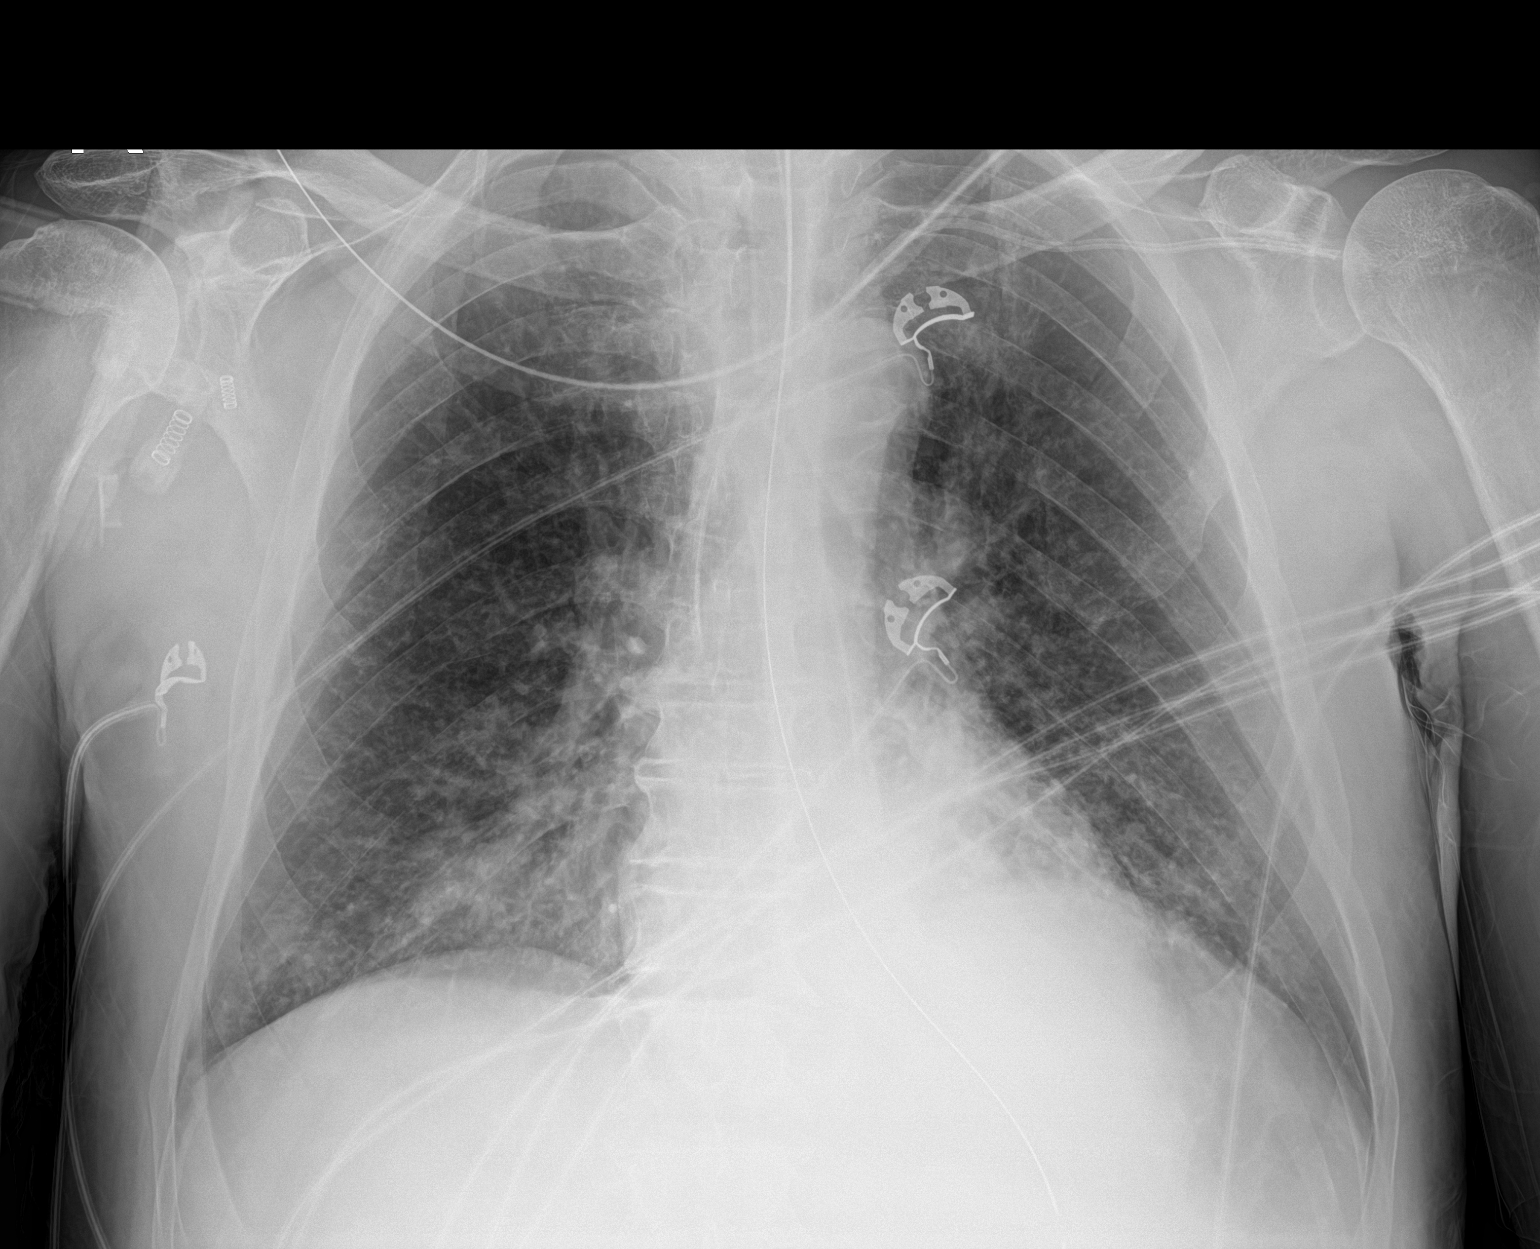

[1 of 1 positions shown; findings below may reference images not displayed]

FINDINGS: Single frontal view of the chest demonstrates endotracheal tube,
enteric catheter, and left subclavian central venous catheter
unchanged. The bilateral tree in bud airspace disease seen on prior
CT is again seen on this exam. No new consolidation. The small
pleural effusion seen on CT are not visible by chest x-ray. No
pneumothorax.
IMPRESSION: 1. Continued tree-in-bud ground-glass airspace disease throughout
the lungs, greatest at the bases. Differential includes infection or
aspiration.
